# Patient Record
Sex: Male | Born: 1980 | Race: Black or African American | Hispanic: No | Marital: Married | Smoking: Never smoker
Health system: Southern US, Community
[De-identification: ages and names within clinical notes are randomized; demographics above are authoritative.]

## PROBLEM LIST (undated history)

## (undated) DIAGNOSIS — E119 Type 2 diabetes mellitus without complications: Secondary | ICD-10-CM

## (undated) DIAGNOSIS — I10 Essential (primary) hypertension: Secondary | ICD-10-CM

## (undated) DIAGNOSIS — F419 Anxiety disorder, unspecified: Secondary | ICD-10-CM

## (undated) DIAGNOSIS — E669 Obesity, unspecified: Secondary | ICD-10-CM

## (undated) HISTORY — DX: Obesity, unspecified: E66.9

## (undated) HISTORY — DX: Type 2 diabetes mellitus without complications: E11.9

## (undated) HISTORY — DX: Anxiety disorder, unspecified: F41.9

---

## 2005-12-26 ENCOUNTER — Emergency Department (HOSPITAL_COMMUNITY): Admission: EM | Admit: 2005-12-26 | Discharge: 2005-12-26 | Payer: Self-pay | Admitting: Family Medicine

## 2007-04-24 ENCOUNTER — Emergency Department (HOSPITAL_COMMUNITY): Admission: EM | Admit: 2007-04-24 | Discharge: 2007-04-24 | Payer: Self-pay | Admitting: Emergency Medicine

## 2011-08-24 LAB — POCT CARDIAC MARKERS
Myoglobin, poc: 62.5
Myoglobin, poc: 75.9
Operator id: 146091
Operator id: 288331
Troponin i, poc: <0.05

## 2011-08-24 LAB — I-STAT 8, (EC8 V) (CONVERTED LAB)
Chloride: 104
Glucose, Bld: 87
Hemoglobin: 16.7
Potassium: 4.1
Sodium: 138
TCO2: 29
pH, Ven: 7.388 — ABNORMAL HIGH

## 2011-08-24 LAB — POCT I-STAT CREATININE: Operator id: 146091

## 2011-08-24 LAB — D-DIMER, QUANTITATIVE (NOT AT ARMC): D-Dimer, Quant: <0.22

## 2011-12-05 ENCOUNTER — Ambulatory Visit (INDEPENDENT_AMBULATORY_CARE_PROVIDER_SITE_OTHER): Payer: Managed Care, Other (non HMO)

## 2011-12-05 DIAGNOSIS — Z23 Encounter for immunization: Secondary | ICD-10-CM

## 2011-12-05 DIAGNOSIS — N410 Acute prostatitis: Secondary | ICD-10-CM

## 2012-01-16 ENCOUNTER — Emergency Department (HOSPITAL_COMMUNITY)
Admission: EM | Admit: 2012-01-16 | Discharge: 2012-01-16 | Disposition: A | Discharge status: Home / Self Care | Payer: Self-pay

## 2013-01-26 ENCOUNTER — Ambulatory Visit (INDEPENDENT_AMBULATORY_CARE_PROVIDER_SITE_OTHER): Payer: No Typology Code available for payment source | Admitting: Family Medicine

## 2013-01-26 VITALS — BP 154/100 | HR 81 | Temp 98.4°F | Resp 16 | Ht 73.0 in | Wt 283.0 lb

## 2013-01-26 DIAGNOSIS — R059 Cough, unspecified: Secondary | ICD-10-CM

## 2013-01-26 DIAGNOSIS — R040 Epistaxis: Secondary | ICD-10-CM

## 2013-01-26 DIAGNOSIS — R030 Elevated blood-pressure reading, without diagnosis of hypertension: Secondary | ICD-10-CM

## 2013-01-26 DIAGNOSIS — IMO0001 Reserved for inherently not codable concepts without codable children: Secondary | ICD-10-CM

## 2013-01-26 DIAGNOSIS — J069 Acute upper respiratory infection, unspecified: Secondary | ICD-10-CM

## 2013-01-26 DIAGNOSIS — R05 Cough: Secondary | ICD-10-CM

## 2013-01-26 MED ORDER — BENZONATATE 100 MG PO CAPS: ORAL_CAPSULE | ORAL | Status: DC

## 2013-01-26 NOTE — Patient Instructions (Signed)
Drink plenty of fluids  Get plenty of rest  Use Polysporin in the left side of the nose  Cough medication as needed  Monitor blood pressure  Return if worse

## 2013-01-26 NOTE — Progress Notes (Signed)
Subjective: Patient has had a cold for the last couple of days with head congestion and sore throat and doing some coughing. He has sneezed a few times. Today he he was with his wife from whom he is separated. He got angry. His phlegm that he coughed up was bloody, then it was bloody your. Then he started bleeding from the left nares. He came over here to get checked.  Objective: Pleasant alert gentleman in no major distress at this time. TMs normal. Right nares appears normal. Left nares has an area of redness and almost excoriation on the left very the most outward area of the turbinate. His neck was supple without nodes. Chest is clear to auscultation. His throat is clear.  Assessment: URI with secondary cough and pharyngitis Epistaxis Elevated blood pressure  Plan: Use Polysporin his nose. Monitor his blood pressure. Let the cough and cold run its course. Cough suppressant medication. Return if worse. Will give a prescription for Tessalon. If his cough is getting worse we can give him some Hycodan. If he is not doing better by midweek he can return for recheck.

## 2013-01-28 ENCOUNTER — Telehealth: Payer: Self-pay

## 2013-01-28 NOTE — Telephone Encounter (Signed)
Please advise 

## 2013-01-28 NOTE — Telephone Encounter (Signed)
Patient was seen Saturday by Dr. Alwyn Ren and he strongly believes that he does not need cough medication that he needs antibiotics for what he believes is a sinus infection. He is hoping that Dr. Alwyn Ren can call in an antibiotic for him without being seen. Please advise best number: [phone] Pharamacy: [facility] on [address]

## 2013-01-31 MED ORDER — AZITHROMYCIN 250 MG PO TABS: ORAL_TABLET | ORAL | Status: DC

## 2013-01-31 NOTE — Telephone Encounter (Signed)
I apologize for not getting back to him.  This was totally consistent with a virus URI infection, and no antibiotics should be taken for viruses.  If however it has gone on still (5-7 days total) we will rx an antibiotic.    If not better give Z-pack.  Take as directed.  Return if worse

## 2013-01-31 NOTE — Telephone Encounter (Signed)
Left message for him to call me back to advise how he is feeling.

## 2013-01-31 NOTE — Telephone Encounter (Signed)
Patient advised.

## 2014-09-23 ENCOUNTER — Ambulatory Visit (INDEPENDENT_AMBULATORY_CARE_PROVIDER_SITE_OTHER): Payer: BC Managed Care – PPO | Admitting: Physician Assistant

## 2014-09-23 VITALS — BP 142/80 | HR 79 | Temp 97.8°F | Resp 18 | Ht 73.0 in | Wt 278.6 lb

## 2014-09-23 DIAGNOSIS — R030 Elevated blood-pressure reading, without diagnosis of hypertension: Secondary | ICD-10-CM

## 2014-09-23 DIAGNOSIS — H6121 Impacted cerumen, right ear: Secondary | ICD-10-CM

## 2014-09-23 DIAGNOSIS — J019 Acute sinusitis, unspecified: Secondary | ICD-10-CM

## 2014-09-23 DIAGNOSIS — IMO0001 Reserved for inherently not codable concepts without codable children: Secondary | ICD-10-CM

## 2014-09-23 MED ORDER — GUAIFENESIN ER 1200 MG PO TB12: 1.0000 | ORAL_TABLET | Freq: Two times a day (BID) | ORAL | Status: DC | PRN

## 2014-09-23 MED ORDER — AMOXICILLIN-POT CLAVULANATE 875-125 MG PO TABS: 1.0000 | ORAL_TABLET | Freq: Two times a day (BID) | ORAL | Status: DC

## 2014-09-23 NOTE — Progress Notes (Signed)
I was directly involved with the patient's care and agree with the physical, diagnosis and treatment plan.  

## 2014-09-23 NOTE — Patient Instructions (Signed)
Your symptoms are most likely due to a sinus infection (sinusitis). Please take the antibiotic twice daily for 7 days. Please take the mucinex twice daily for 7-10 days.  Using a nasal saline spray may help. Taking a hot shower, going into a sauna, anything where you can be around hot steam may help.  We cleaned out your right ear today.  Please remember to keep an eye on your blood pressure. It was mildly elevated today.   Sinusitis Sinusitis is redness, soreness, and inflammation of the paranasal sinuses. Paranasal sinuses are air pockets within the bones of your face (beneath the eyes, the middle of the forehead, or above the eyes). In healthy paranasal sinuses, mucus is able to drain out, and air is able to circulate through them by way of your nose. However, when your paranasal sinuses are inflamed, mucus and air can become trapped. This can allow bacteria and other germs to grow and cause infection. Sinusitis can develop quickly and last only a short time (acute) or continue over a long period (chronic). Sinusitis that lasts for more than 12 weeks is considered chronic.  CAUSES  Causes of sinusitis include:  Allergies.  Structural abnormalities, such as displacement of the cartilage that separates your nostrils (deviated septum), which can decrease the air flow through your nose and sinuses and affect sinus drainage.  Functional abnormalities, such as when the small hairs (cilia) that line your sinuses and help remove mucus do not work properly or are not present. SIGNS AND SYMPTOMS  Symptoms of acute and chronic sinusitis are the same. The primary symptoms are pain and pressure around the affected sinuses. Other symptoms include:  Upper toothache.  Earache.  Headache.  Bad breath.  Decreased sense of smell and taste.  A cough, which worsens when you are lying flat.  Fatigue.  Fever.  Thick drainage from your nose, which often is green and may contain pus  (purulent).  Swelling and warmth over the affected sinuses. DIAGNOSIS  Your health care provider will perform a physical exam. During the exam, your health care provider may:  Look in your nose for signs of abnormal growths in your nostrils (nasal polyps).  Tap over the affected sinus to check for signs of infection.  View the inside of your sinuses (endoscopy) using an imaging device that has a light attached (endoscope). If your health care provider suspects that you have chronic sinusitis, one or more of the following tests may be recommended:  Allergy tests.  Nasal culture. A sample of mucus is taken from your nose, sent to a lab, and screened for bacteria.  Nasal cytology. A sample of mucus is taken from your nose and examined by your health care provider to determine if your sinusitis is related to an allergy. TREATMENT  Most cases of acute sinusitis are related to a viral infection and will resolve on their own within 10 days. Sometimes medicines are prescribed to help relieve symptoms (pain medicine, decongestants, nasal steroid sprays, or saline sprays).  However, for sinusitis related to a bacterial infection, your health care provider will prescribe antibiotic medicines. These are medicines that will help kill the bacteria causing the infection.  Rarely, sinusitis is caused by a fungal infection. In theses cases, your health care provider will prescribe antifungal medicine. For some cases of chronic sinusitis, surgery is needed. Generally, these are cases in which sinusitis recurs more than 3 times per year, despite other treatments. HOME CARE INSTRUCTIONS   Drink plenty of water. Water  helps thin the mucus so your sinuses can drain more easily.  Use a humidifier.  Inhale steam 3 to 4 times a day (for example, sit in the bathroom with the shower running).  Apply a warm, moist washcloth to your face 3 to 4 times a day, or as directed by your health care provider.  Use  saline nasal sprays to help moisten and clean your sinuses.  Take medicines only as directed by your health care provider.  If you were prescribed either an antibiotic or antifungal medicine, finish it all even if you start to feel better. SEEK IMMEDIATE MEDICAL CARE IF:  You have increasing pain or severe headaches.  You have nausea, vomiting, or drowsiness.  You have swelling around your face.  You have vision problems.  You have a stiff neck.  You have difficulty breathing. MAKE SURE YOU:   Understand these instructions.  Will watch your condition.  Will get help right away if you are not doing well or get worse. Document Released: 10/24/2005 Document Revised: 03/10/2014 Document Reviewed: 11/08/2011 [facility] Patient Information 2015 [place], [place]. This information is not intended to replace advice given to you by your health care provider. Make sure you discuss any questions you have with your health care provider.

## 2014-09-23 NOTE — Progress Notes (Signed)
Subjective:    Patient ID: Jeremy Mooney, male    DOB: [birth year], 33 y.o.   MRN: [id number]  No PCP Per Patient  Chief Complaint  Patient presents with  . Cough    x2 weeks; yellow-green sputum  . Sore Throat  . Sinusitis    extreme sinus pressure; pt states it hurts when he lays down; hard to sleep   There are no active problems to display for this patient.  Prior to Admission medications   Medication Sig Start Date End Date Taking? Authorizing Provider  amoxicillin-clavulanate (AUGMENTIN) 875-125 MG per tablet Take 1 tablet by mouth 2 (two) times daily. 09/23/14   Abir Craine, PA  Guaifenesin [facility](MUCINEX MAXIMUM STRENGTH) 1200 MG TB12 Take 1 tablet (1,200 mg total) by mouth every 12 (twelve) hours as needed. 09/23/14   Raelyn Ensignodd Miran Kautzman, PA   Medications, allergies, past medical history, surgical history, family history, social history and problem list reviewed and updated.  HPI  [age] yom healthy male with no sig PMH presents with head pressure, congestion.  He initially started feeling sick 2 wks ago after his toddler son was sick. Pt initially felt sore throat, had rhinorrhea, and had prod cough with yellow/green sputum. This lasted for approx 1 week and these sx resolved. Unfortunately after these sx resolved he began to feel that his head was congested about 5 days ago. He has felt congested, unable to lie flat, and dizzy when he bends forward over the past few days. He hasn't checked his temp at home. Denies chills. No abd pain, N/V, diarrhea. No SOB, CP. Has tried ibuprofen and dayquil which haven't helped with the congestion.   Review of Systems No dysuria. See HPI.     Objective:   Physical Exam  Constitutional: He is oriented to person, place, and time. He appears well-developed and well-nourished.  Non-toxic appearance. He does not have a sickly appearance. He appears ill. No distress.  BP 142/80 mmHg  Pulse 79  Temp(Src) 97.8 F (36.6 C) (Oral)  Resp 18  Ht 6\' 1"  (1.854  m)  Wt 278 lb 9.6 oz (126.372 kg)  BMI 36.76 kg/m2  SpO2 98%   HENT:  Head: Normocephalic and atraumatic.  Left Ear: Tympanic membrane is not injected, not erythematous, not retracted and not bulging. A middle ear effusion is present.  Nose: Mucosal edema present. No rhinorrhea or sinus tenderness. Right sinus exhibits no maxillary sinus tenderness and no frontal sinus tenderness. Left sinus exhibits no maxillary sinus tenderness and no frontal sinus tenderness.  Mouth/Throat: Uvula is midline, oropharynx is clear and moist and mucous membranes are normal. No oropharyngeal exudate, posterior oropharyngeal edema or posterior oropharyngeal erythema.  Right ear with cerumen impaction.   Eyes: Conjunctivae and EOM are normal. Pupils are equal, round, and reactive to light.  Neck: No Brudzinski's sign noted.  Cardiovascular: Normal rate, regular rhythm, S1 normal, S2 normal and normal heart sounds.  Exam reveals no gallop.   No murmur heard. Pulmonary/Chest: Breath sounds normal. He has no decreased breath sounds. He has no wheezes. He has no rhonchi. He has no rales.  Lymphadenopathy:       Head (right side): No submental, no submandibular and no tonsillar adenopathy present.       Head (left side): No submental, no submandibular and no tonsillar adenopathy present.    He has no cervical adenopathy.  Neurological: He is alert and oriented to person, place, and time.  Psychiatric: He has a normal mood and affect.  His speech is normal.   Right ear flushed: Superior TM erythematous post flush. No fluid. Not retracted, bulging.      Assessment & Plan:   [age] yom healthy male with no sig PMH presents with head pressure, congestion.  Acute sinusitis, recurrence not specified, unspecified location - Plan: amoxicillin-clavulanate (AUGMENTIN) 875-125 MG per tablet, Guaifenesin (MUCINEX MAXIMUM STRENGTH) 1200 MG TB12 --Sinusitis likely cause of sx as initial uri had resolved and head  congestion/pressure secondarily presented --augmentin --mucinex --recommend nasal saline, warm showers/sauna for steam --rtc 5 days if not resolved  Cerumen impaction, right --right ear flushed  Elevated blood pressure --Mildly elevated in clinic today --no hx htn --pt states going through stressful few weeks, encouraged to monitor BP at home with cuff or at pharmacy, f/u with us if readings stay >140/90.  Donnajean Lopesodd M. Jaedin Trumbo, PA-C Physician Assistant-Certified [facility]  09/23/2014 9:55 AM    Steam  Nasal spray mucinex

## 2014-11-07 ENCOUNTER — Ambulatory Visit (INDEPENDENT_AMBULATORY_CARE_PROVIDER_SITE_OTHER): Payer: BLUE CROSS/BLUE SHIELD

## 2014-11-07 ENCOUNTER — Ambulatory Visit (INDEPENDENT_AMBULATORY_CARE_PROVIDER_SITE_OTHER): Payer: BC Managed Care – PPO | Admitting: Family Medicine

## 2014-11-07 VITALS — BP 136/90 | HR 82 | Temp 98.1°F | Resp 16 | Ht 72.5 in | Wt 282.0 lb

## 2014-11-07 DIAGNOSIS — J029 Acute pharyngitis, unspecified: Secondary | ICD-10-CM

## 2014-11-07 DIAGNOSIS — S52021G Displaced fracture of olecranon process without intraarticular extension of right ulna, subsequent encounter for closed fracture with delayed healing: Secondary | ICD-10-CM

## 2014-11-07 DIAGNOSIS — M7712 Lateral epicondylitis, left elbow: Secondary | ICD-10-CM

## 2014-11-07 LAB — POCT RAPID STREP A (OFFICE): Rapid Strep A Screen: NEGATIVE

## 2014-11-07 MED ORDER — HYDROCODONE-ACETAMINOPHEN 7.5-325 MG/15ML PO SOLN: 5.0000 mL | ORAL | Status: DC | PRN

## 2014-11-07 MED ORDER — CEFDINIR 300 MG PO CAPS: 600.0000 mg | ORAL_CAPSULE | Freq: Every day | ORAL | Status: DC

## 2014-11-07 MED ORDER — DICLOFENAC SODIUM 3 % TD GEL: 2.0000 g | Freq: Four times a day (QID) | TRANSDERMAL | Status: DC

## 2014-11-07 MED ORDER — MELOXICAM 15 MG PO TABS: 15.0000 mg | ORAL_TABLET | Freq: Every day | ORAL | Status: DC

## 2014-11-07 NOTE — Progress Notes (Signed)
Subjective:    Patient ID: Jeremy Mooney, male    DOB: 10-Jul-1981, 34 y.o.   MRN: [id number] Chief Complaint  Patient presents with  . Sore Throat    HPI  Developed a sore throat 3d ago - usually does at the beginning of a URI but this time never really progressed to this but instead throat pain has significantly worsened.  Having a lot of severe stabbing pain, difficult to swallow.  Then he saw white spots on the back of hit throat and tongue.  + chills, + rhinitis, + cough.  Coughing of yellow-green mucous. Still able to eat and drink though very painful. Using theraflu and dayquil as well as advil for arthralgias/myalgias. Has a 2 yo son in daycare.  Sev mos ago pt was wrestling with his son and heard his right elbow pop - since then he has had elbow pain, even with lifting minimal things such as his camera and using the mouse - pt is a Environmental manager.  Had similar injury many years prior that was never evaluated or treated. Pain is intermittent but no improvement with RICE or nsaids.  Past Medical History  Diagnosis Date  . Anxiety    No current outpatient prescriptions on file prior to visit.   No current facility-administered medications on file prior to visit.   No Known Allergies   Review of Systems  Constitutional: Positive for chills, activity change, appetite change and fatigue. Negative for fever and diaphoresis.  HENT: Positive for congestion, postnasal drip, rhinorrhea, sore throat, trouble swallowing and voice change.   Respiratory: Positive for cough. Negative for shortness of breath and wheezing.   Musculoskeletal: Positive for myalgias and arthralgias. Negative for back pain, joint swelling and gait problem.  Skin: Negative for color change, rash and wound.  Neurological: Positive for weakness. Negative for numbness.  Hematological: Negative for adenopathy. Does not bruise/bleed easily.       Objective:  BP 136/90 mmHg  Pulse 82  Temp(Src) 98.1 F (36.7 C)  (Oral)  Resp 16  Ht 6' 0.5" (1.842 m)  Wt 282 lb (127.914 kg)  BMI 37.70 kg/m2  SpO2 99%  Physical Exam  Constitutional: He is oriented to person, place, and time. He appears well-developed and well-nourished. No distress.  HENT:  Head: Normocephalic and atraumatic.  Right Ear: Tympanic membrane, external ear and ear canal normal.  Left Ear: Tympanic membrane, external ear and ear canal normal.  Nose: Nose normal.  Mouth/Throat: Uvula is midline and mucous membranes are normal. No trismus in the jaw. No uvula swelling. Posterior oropharyngeal erythema present. No oropharyngeal exudate or posterior oropharyngeal edema.  Eyes: Conjunctivae are normal. No scleral icterus.  Neck: Normal range of motion. Neck supple. No thyromegaly present.  Cardiovascular: Normal rate, regular rhythm, normal heart sounds and intact distal pulses.   Pulmonary/Chest: Effort normal and breath sounds normal. No respiratory distress.  Musculoskeletal: He exhibits no edema.       Right shoulder: Normal.       Right elbow: He exhibits normal range of motion, no swelling, no effusion and no laceration. Tenderness found. Lateral epicondyle and olecranon process tenderness noted. No radial head and no medial epicondyle tenderness noted.       Left elbow: Normal.       Right wrist: Normal.  Lymphadenopathy:    He has no cervical adenopathy.  Neurological: He is alert and oriented to person, place, and time. He has normal strength. He displays no atrophy and no tremor. No  sensory deficit. He exhibits normal muscle tone. Gait normal.  Skin: Skin is warm and dry. He is not diaphoretic. No erythema.  Psychiatric: He has a normal mood and affect. His behavior is normal.      Results for orders placed or performed in visit on 11/07/14  POCT rapid strep A  Result Value Ref Range   Rapid Strep A Screen Negative Negative   [facility] reading (PRIMARY) by  Dr. Clelia Croft. Right elbow: avulsion fracture of tip of bony spur off of  olecranon.    Dg Elbow Complete Left  11/07/2014   CLINICAL DATA:  Elbow pain  EXAM: LEFT ELBOW - COMPLETE 3+ VIEW  COMPARISON:  None.  FINDINGS: There is osteophytosis extending from the olecranon with cortical discontinuity at the osteophyte suggesting fracture. This is best seen on lateral view. There is no dislocation. Soft tissues are unremarkable.  IMPRESSION: There is osteophytosis extending from the olecranon with cortical discontinuity at the osteophyte suggesting fracture. This is best seen on lateral view.   Electronically Signed   By: Sherian Rein M.D.   On: 11/07/2014 10:43    Assessment & Plan:   Sore throat - Plan: POCT rapid strep A, Culture, Group A Strep  Lateral epicondylitis of elbow, left - Plan: DG Elbow Complete Left, Ambulatory referral to Orthopedic Surgery  Olecranon fracture, right, closed, with delayed healing, subsequent encounter - Plan: Ambulatory referral to Orthopedic Surgery - recommend using tennis elbow strap at all times until ortho eval to avoid further displacement or injury to olecranon avulsion fracture - this  Is subacute from sev mos prior.  Meds ordered this encounter  Medications  . Diclofenac Sodium 3 % GEL    Sig: Place 2 g onto the skin 4 (four) times daily.    Dispense:  100 g    Refill:  1  . meloxicam (MOBIC) 15 MG tablet    Sig: Take 1 tablet (15 mg total) by mouth daily.    Dispense:  30 tablet    Refill:  1  . HYDROcodone-acetaminophen (HYCET) 7.5-325 mg/15 ml solution    Sig: Take 5-10 mLs by mouth every 4 (four) hours as needed for moderate pain.    Dispense:  140 mL    Refill:  0  . cefdinir (OMNICEF) 300 MG capsule    Sig: Take 2 capsules (600 mg total) by mouth daily.    Dispense:  20 capsule    Refill:  0    I personally performed the services described in this documentation, which was scribed in my presence. The recorded information has been reviewed and considered, and addended by me as needed.  Norberto Sorenson, MD MPH

## 2014-11-07 NOTE — Patient Instructions (Signed)

## 2014-11-09 LAB — CULTURE, GROUP A STREP: Organism ID, Bacteria: NORMAL

## 2015-07-02 ENCOUNTER — Ambulatory Visit (INDEPENDENT_AMBULATORY_CARE_PROVIDER_SITE_OTHER): Payer: BLUE CROSS/BLUE SHIELD | Admitting: Family Medicine

## 2015-07-02 ENCOUNTER — Ambulatory Visit (INDEPENDENT_AMBULATORY_CARE_PROVIDER_SITE_OTHER): Payer: BLUE CROSS/BLUE SHIELD

## 2015-07-02 VITALS — BP 138/80 | HR 74 | Temp 98.1°F | Resp 18 | Ht 73.5 in | Wt 286.0 lb

## 2015-07-02 DIAGNOSIS — M25571 Pain in right ankle and joints of right foot: Secondary | ICD-10-CM

## 2015-07-02 DIAGNOSIS — M25511 Pain in right shoulder: Secondary | ICD-10-CM

## 2015-07-02 DIAGNOSIS — M12511 Traumatic arthropathy, right shoulder: Secondary | ICD-10-CM | POA: Diagnosis not present

## 2015-07-02 DIAGNOSIS — M12811 Other specific arthropathies, not elsewhere classified, right shoulder: Secondary | ICD-10-CM

## 2015-07-02 NOTE — Progress Notes (Signed)
Chief Complaint:  Chief Complaint  Patient presents with  . Foot Pain    rt great toe x2 mths     HPI: Jeremy Mooney is a 34 y.o. 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 No hx of gout.  Right ankle fracture and prior broken great toe  Right shoulder pain , separated shoulder in 1995 playign foootball, last 1 year worse, after bowling, sleeping on it makes worse,  He no numbness but some tingling if he sleeps on it HE has no neck pain. HE has problems with shootingpain when he brings his arms down from shoulder to wiast height, he has problmes with internal and external rotation Sharp and cahing pain, soemtimes no triggers, sharp pain with certain ROM.     Past Medical History  Diagnosis Date  . Anxiety    History reviewed. No pertinent past surgical history. Social History   Social History  . Marital Status: Married    Spouse Name: N/A  . Number of Children: N/A  . Years of Education: N/A   Social History Main Topics  . Smoking status: Never Smoker   . Smokeless tobacco: Never Used  . Alcohol Use: Yes  . Drug Use: No  . Sexual Activity: Yes   Other Topics Concern  . None   Social History Narrative   Family History  Problem Relation Age of Onset  . Cancer Father   . Bowel Disease Mother    No Known Allergies Prior to Admission medications   Medication Sig Start Date End Date Taking? Authorizing Provider  cefdinir (OMNICEF) 300 MG capsule Take 2 capsules (600 mg total) by mouth daily. Patient not taking: Reported on 07/02/2015 11/07/14   Sherren Mocha, MD  Diclofenac Sodium 3 % GEL Place 2 g onto the skin 4 (four) times daily. Patient not taking: Reported on 07/02/2015 11/07/14   Sherren Mocha, MD    HYDROcodone-acetaminophen (HYCET) 7.5-325 mg/15 ml solution Take 5-10 mLs by mouth every 4 (four) hours as needed for moderate pain. Patient not taking: Reported on 07/02/2015 11/07/14   Sherren Mocha, MD  meloxicam (MOBIC) 15 MG tablet Take 1 tablet (15 mg total) by mouth daily. Patient not taking: Reported on 07/02/2015 11/07/14   Sherren Mocha, MD     ROS: The patient denies fevers, chills, night sweats, unintentional weight loss, chest pain, palpitations, wheezing, dyspnea on exertion, nausea, vomiting, abdominal pain, dysuria, hematuria, melena, numbness, weakness, or tingling.   All other systems have been reviewed and were otherwise negative with the exception of those mentioned in the HPI and as above.    PHYSICAL EXAM: Filed Vitals:   07/02/15 0921  BP: 138/80  Pulse: 74  Temp: 98.1 F (36.7 C)  Resp: 18   Body mass index is 37.22 kg/(m^2).   General: Alert, no acute distress HEENT:  Normocephalic, atraumatic, oropharynx patent. EOMI, PERRLA Cardiovascular:  Regular rate and rhythm, no rubs murmurs or gallops.  No Carotid bruits, radial pulse intact. No pedal edema.  Respiratory: Clear to auscultation bilaterally.  No wheezes, rales, or rhonchi.  No cyanosis, no use of accessory musculature Abdominal: No organomegaly, abdomen is soft  and non-tender, positive bowel sounds. No masses. Skin: No rashes. Neurologic: Facial musculature symmetric. Psychiatric: Patient acts appropriately throughout our interaction. Lymphatic: No cervical or submandibular lymphadenopathy Musculoskeletal: Gait intact. No edema, tenderness Right foot-tender at base of great toe near sesmoid, no erythema, no warmth,  Right shoulder Neck exam normal-neg spurling Shoulder No deformity, no hypertrophy/atrophy, no erythema, no fluid, no wounds Full ROM Nontender at [facility] jt Neg Empty Can test, neg Lift off test, neg Speeds, Neg Hawkins/Neers 5/5 strength, 2/2 triceps and biceps DTRs      LABS: Results for  orders placed or performed in visit on 11/07/14  Culture, Group A Strep  Result Value Ref Range   Organism ID, Bacteria Normal Upper Respiratory Flora    Organism ID, Bacteria No Beta Hemolytic Streptococci Isolated   POCT rapid strep A  Result Value Ref Range   Rapid Strep A Screen Negative Negative     EKG/XRAY:   Primary read interpreted by Dr. Conley Rolls at [facility]. Please comment about lucency at distal  Proximal phalanges great toe Old chronic changes prior fx  Base 1st metatarsal Shoulder is normal   ASSESSMENT/PLAN: Encounter Diagnoses  Name Primary?  . Pain in joint, ankle and foot, right Yes  . Right shoulder pain   . Rotator cuff arthropathy, right     34 y/o male with right foot and shoulde rpain Try voltaren gel for now, he has beenn on mobic in the past with minimal releif.  Change foot wear, wear insoles Will let him know about official xray result and plan Gross sideeffects, risk and benefits, and alternatives of medications d/w patient. Patient is aware that all medications have potential sideeffects and we are unable to predict every sideeffect or drug-drug interaction that may occur.  Hamilton Capri DO  07/02/2015 11:32 AM   Dw patient official xray results Will try a trial of indocin and see if it helps EXAM: RIGHT FOOT COMPLETE - 3+ VIEW  COMPARISON: None.  FINDINGS: No acute bony or joint abnormality identified. No evidence fracture dislocation. Diffuse degenerative change.  IMPRESSION: No acute or focal abnormality. Diffuse degenerative change.   Electronically Signed  By: Maisie Fus Register  On: 07/02/2015 12:49

## 2015-07-02 NOTE — Patient Instructions (Signed)

## 2015-07-03 MED ORDER — INDOMETHACIN 50 MG PO CAPS: 50.0000 mg | ORAL_CAPSULE | Freq: Two times a day (BID) | ORAL | Status: DC

## 2016-02-20 ENCOUNTER — Ambulatory Visit (INDEPENDENT_AMBULATORY_CARE_PROVIDER_SITE_OTHER): Payer: BLUE CROSS/BLUE SHIELD

## 2016-02-20 ENCOUNTER — Ambulatory Visit (HOSPITAL_COMMUNITY)
Admission: EM | Admit: 2016-02-20 | Discharge: 2016-02-20 | Disposition: A | Discharge status: Home / Self Care | Payer: BLUE CROSS/BLUE SHIELD | Attending: Emergency Medicine | Admitting: Emergency Medicine

## 2016-02-20 ENCOUNTER — Encounter (HOSPITAL_COMMUNITY): Payer: Self-pay | Admitting: *Deleted

## 2016-02-20 DIAGNOSIS — S66911A Strain of unspecified muscle, fascia and tendon at wrist and hand level, right hand, initial encounter: Secondary | ICD-10-CM

## 2016-02-20 DIAGNOSIS — M25511 Pain in right shoulder: Secondary | ICD-10-CM

## 2016-02-20 DIAGNOSIS — IMO0001 Reserved for inherently not codable concepts without codable children: Secondary | ICD-10-CM

## 2016-02-20 NOTE — ED Provider Notes (Signed)
CSN: [id number]     Arrival date & time 02/20/16  1856 History   First MD Initiated Contact with Patient 02/20/16 1946     Chief Complaint  Patient presents with  . Hand Injury   (Consider location/radiation/quality/duration/timing/severity/associated sxs/prior Treatment) HPI  Jeremy Mooney is a 35 year old man here for evaluation of right thumb injury. Jeremy Mooney states around 4:30 this afternoon Jeremy Mooney was pushing a shopping cart when Jeremy Mooney injured his right thumb. Jeremy Mooney states Jeremy Mooney thinks it got bent backwards, but is not sure. Jeremy Mooney has had pain at the base of the thumb. It is worse with extension and abduction. Jeremy Mooney does report minimal swelling. Jeremy Mooney has taken Advil with some improvement of pain.  Jeremy Mooney also reports gradually worsening pain in the right shoulder over the last year. Jeremy Mooney reports a traumatic dislocation in his late teens. Jeremy Mooney reports pain is worse when Jeremy Mooney brings his arm down from prolonged abduction. It will also sometimes flareup with rapid extension. This has never been evaluated.  Past Medical History  Diagnosis Date  . Anxiety    History reviewed. No pertinent past surgical history. Family History  Problem Relation Age of Onset  . Cancer Father   . Bowel Disease Mother    Social History  Substance Use Topics  . Smoking status: Never Smoker   . Smokeless tobacco: Never Used  . Alcohol Use: No    Review of Systems As in history of present illness Allergies  Review of patient's allergies indicates no known allergies.  Home Medications   Prior to Admission medications   Not on File   Meds Ordered and Administered this Visit  Medications - No data to display  BP 152/89 mmHg  Pulse 66  Temp(Src) 98.2 F (36.8 C) (Oral)  SpO2 98% No data found.   Physical Exam  Constitutional: Jeremy Mooney is oriented to person, place, and time. Jeremy Mooney appears well-developed and well-nourished. No distress.  Cardiovascular: Normal rate.   Pulmonary/Chest: Effort normal.  Musculoskeletal:  Right thumb: No erythema or  edema. No bruising. No joint laxity when compared to the left. Jeremy Mooney does have some tenderness at the MCP joint.  Brisk cap refill. Normal range of motion. Right shoulder: No erythema or edema. Jeremy Mooney does have some tenderness over the bicipital groove as well as the before meals joint. Empty can negative. Pain when stressing the ac joint.  Neurological: Jeremy Mooney is alert and oriented to person, place, and time.    ED Course  Procedures (including critical care time)  Labs Review Labs Reviewed - No data to display  Imaging Review Dg Hand Complete Right  02/20/2016  CLINICAL DATA:  Right hand pain after blunt trauma EXAM: RIGHT HAND - COMPLETE 3+ VIEW COMPARISON:  None. FINDINGS: No fracture, dislocation, focal osseous lesion or pathologic soft tissue calcifications. 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 IMPRESSION: No fracture or malalignment. Electronically Signed   By: Delbert PhenixJason A Poff M.D.   On: 02/20/2016 20:21      MDM   1. Strain of right thumb, initial encounter   2. Pain in right acromioclavicular joint    Thumb spica brace for thumb sprain. Recommended ice and ibuprofen.  I suspect Jeremy Mooney has some arthritis in his before meals joint. Recommended follow-up with orthopedics for additional evaluation.    Charm RingsErin J Vivienne Sangiovanni, MD 02/20/16 2036

## 2016-02-20 NOTE — ED Notes (Addendum)
Reports jamming right thumb on shopping cart @ approx 1630 today.  C/O pain to base of thumb.

## 2016-02-20 NOTE — Discharge Instructions (Signed)
You have strained the ligaments of your thumb. Wear the brace for the next several days. It is okay to remove this to shower and eat.  You do not have to sleep in it. Apply ice 3 times a day. Take ibuprofen 600 mg every 6 hours as needed for pain.  I suspect to have some arthritis in your ac joint of your shoulder. Ice and ibuprofen will help this as well. Since it has been becoming more bothersome, I would recommend following up with Dr. Charlann Boxerlin in orthopedics for additional evaluation.

## 2016-10-01 IMAGING — CR DG FOOT COMPLETE 3+V*R*
3 series · 3 of 3 positions shown · non-contrast
Comparison: None.

CLINICAL DATA: Pain.  Initial evaluation.

EXAM:
RIGHT FOOT COMPLETE - 3+ VIEW

[AP]
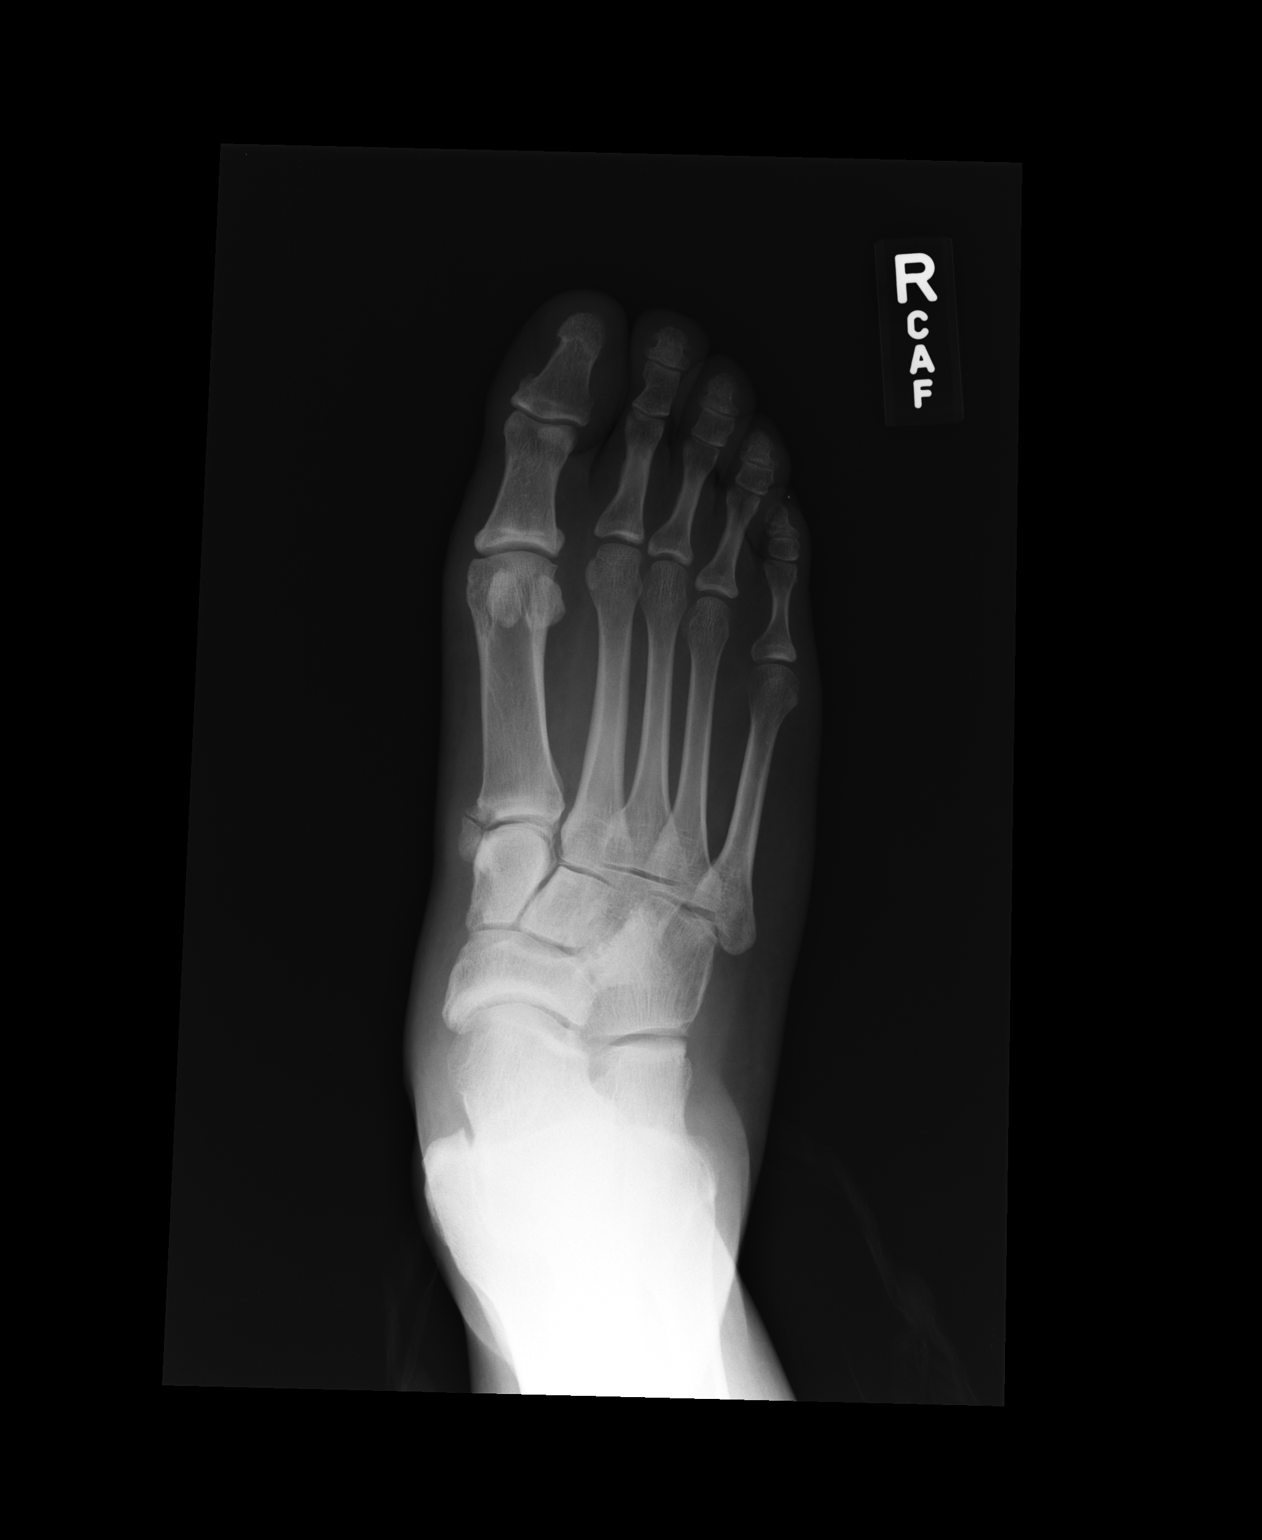

[ap obl int rot]
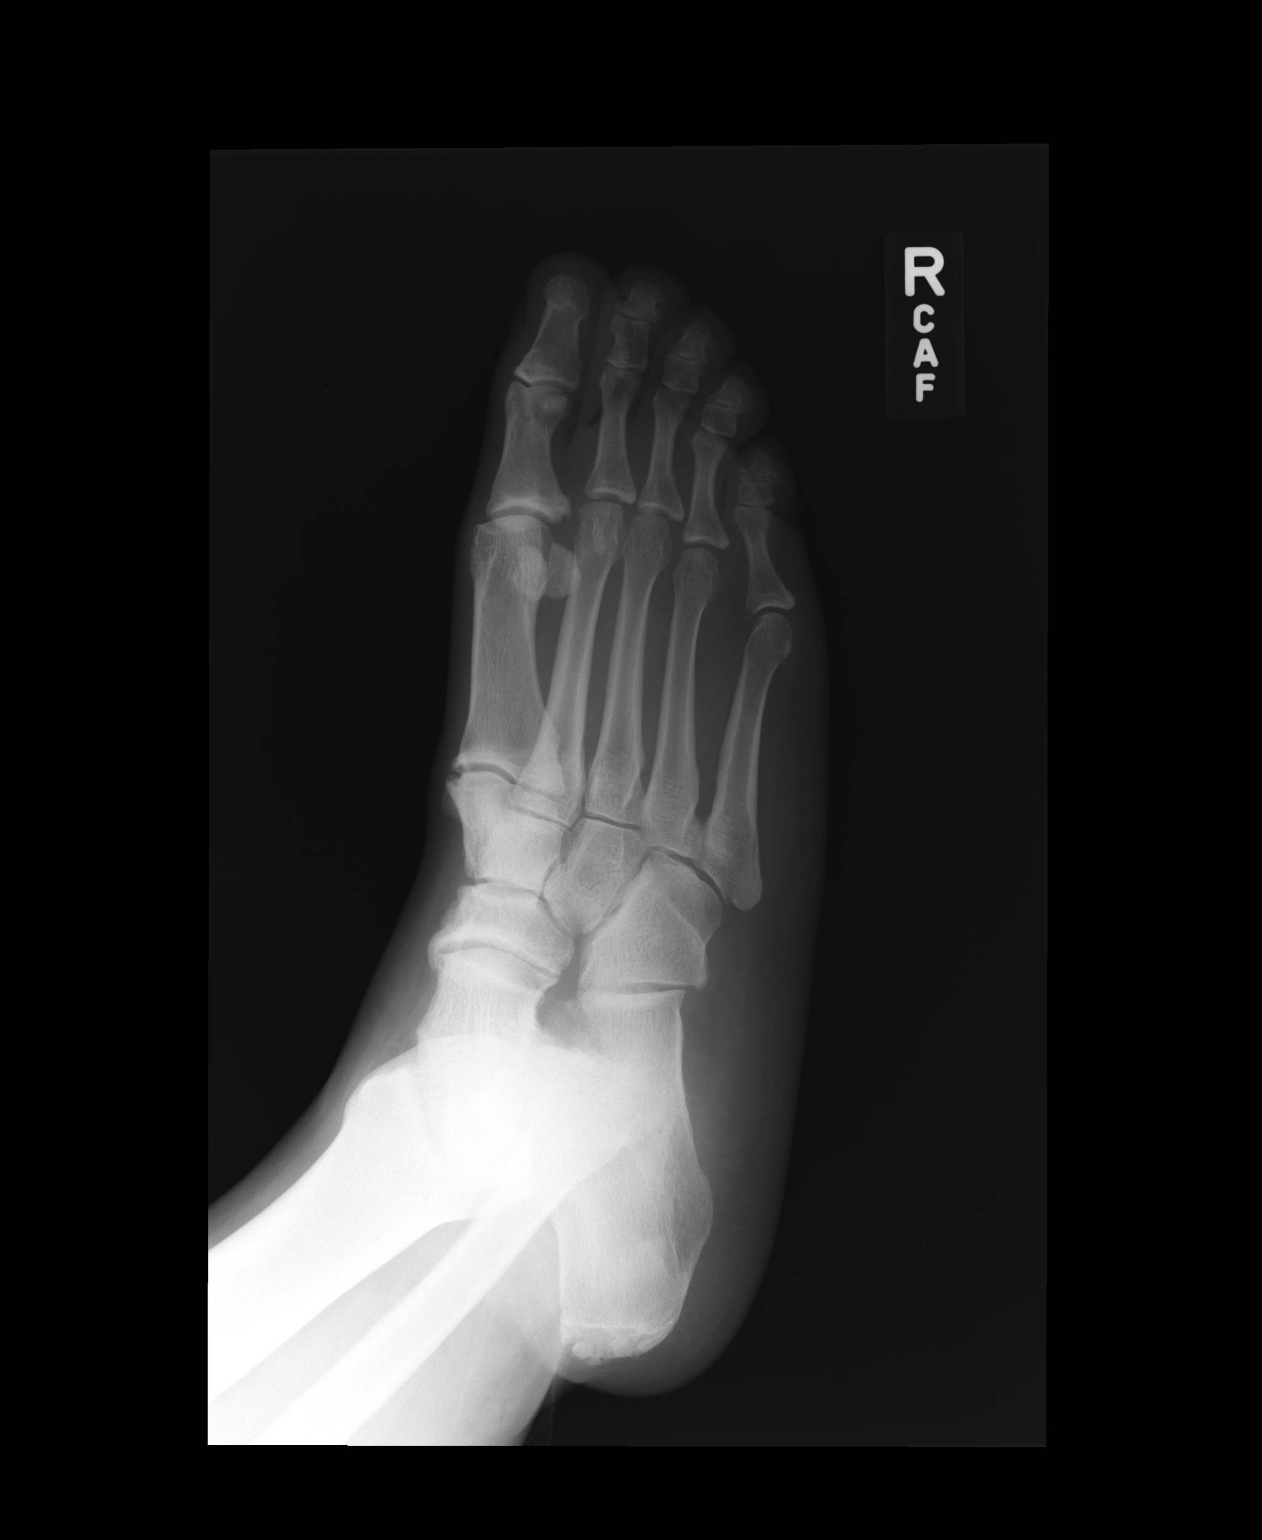

[lateral]
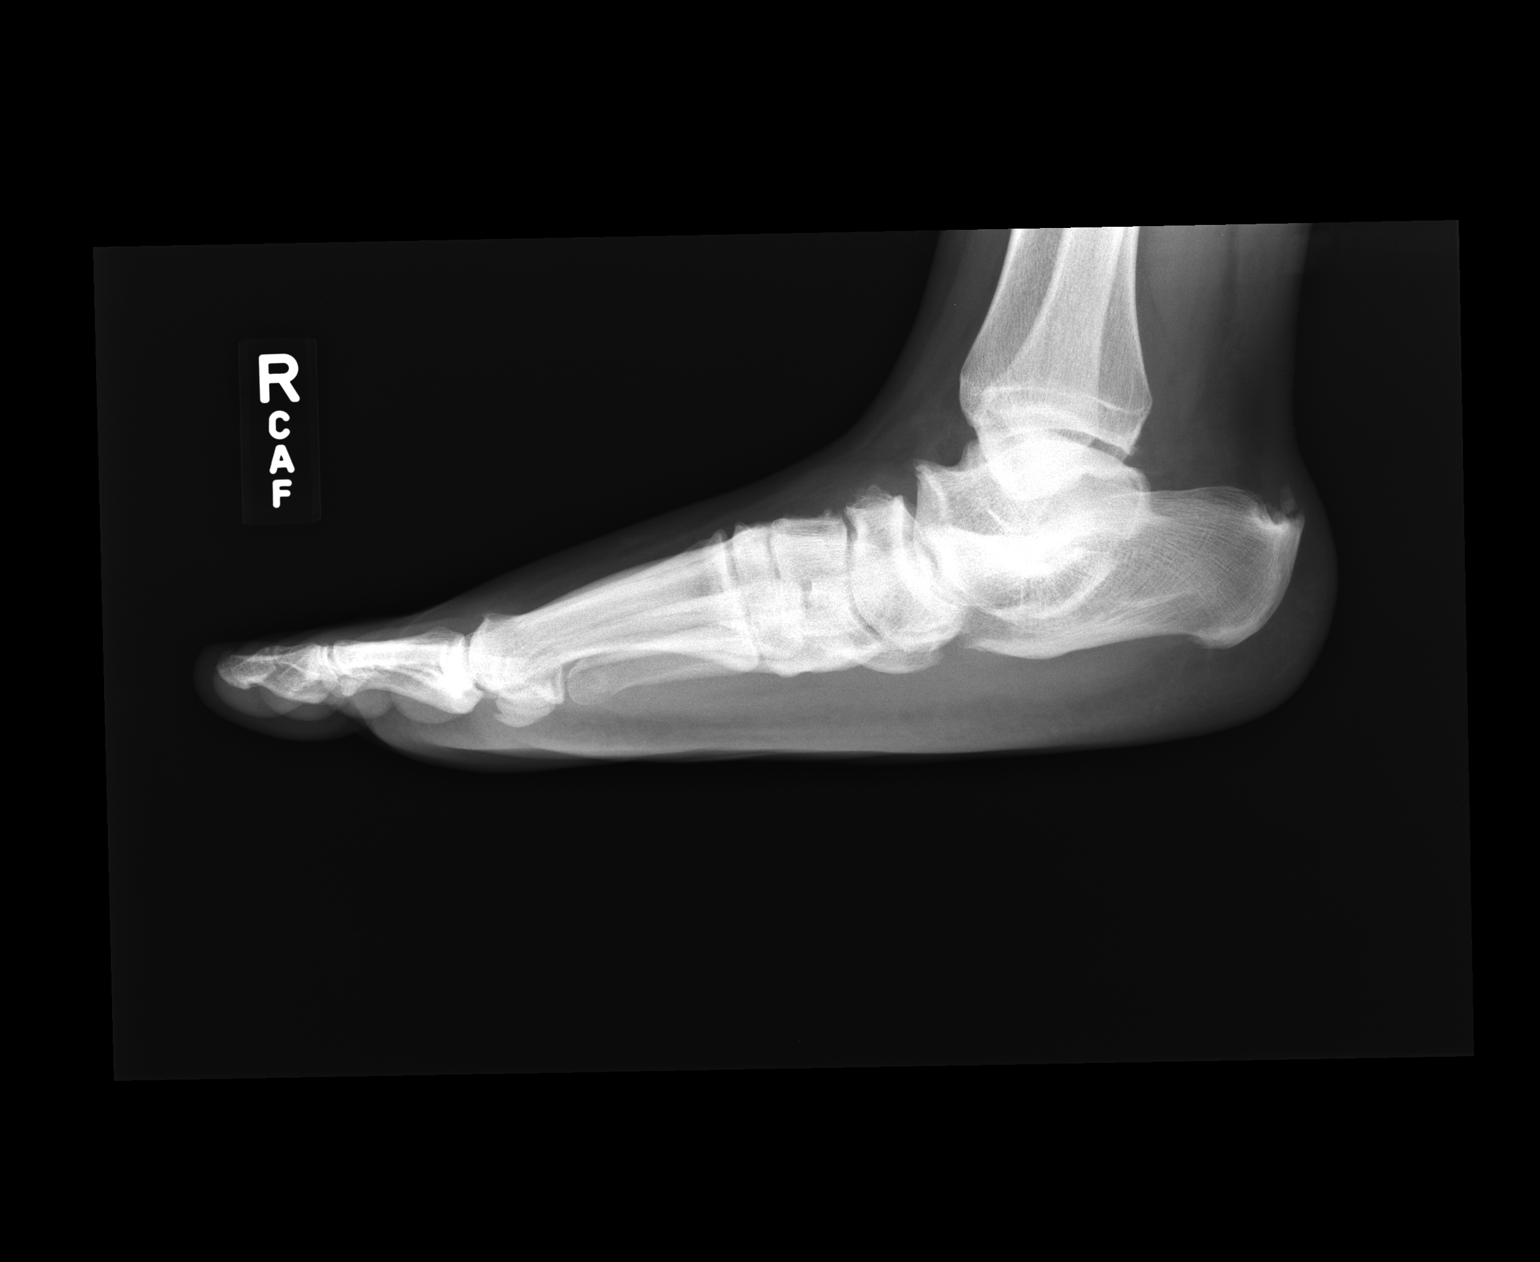

[3 of 3 positions shown; findings below may reference images not displayed]

FINDINGS: No acute bony or joint abnormality identified. No evidence fracture
dislocation. Diffuse degenerative change.
IMPRESSION: No acute or focal abnormality.  Diffuse degenerative change.

## 2016-10-01 IMAGING — CR DG SHOULDER 2+V*R*
3 series · 3 of 3 positions shown · non-contrast
Comparison: None.

CLINICAL DATA: Chronic right shoulder pain, increasing for 1 year.

EXAM:
RIGHT SHOULDER - 2+ VIEW

[AP (1 of 2)]
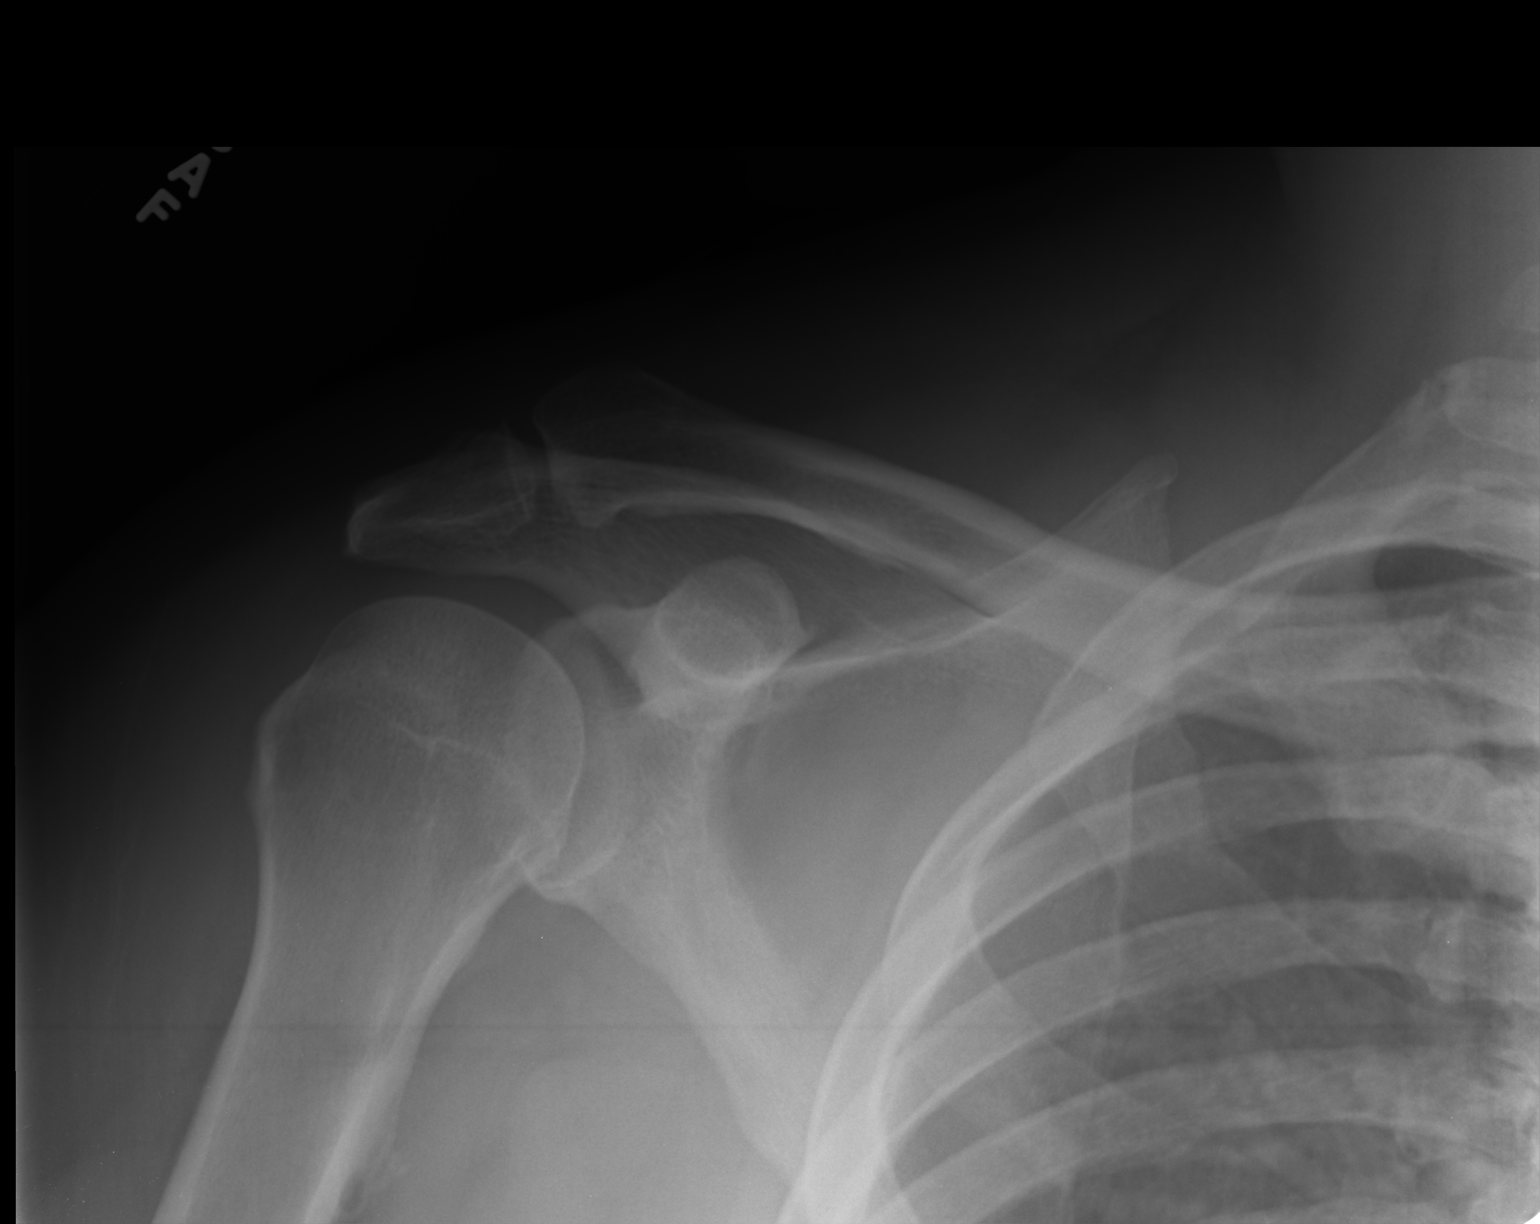

[AP (2 of 2)]
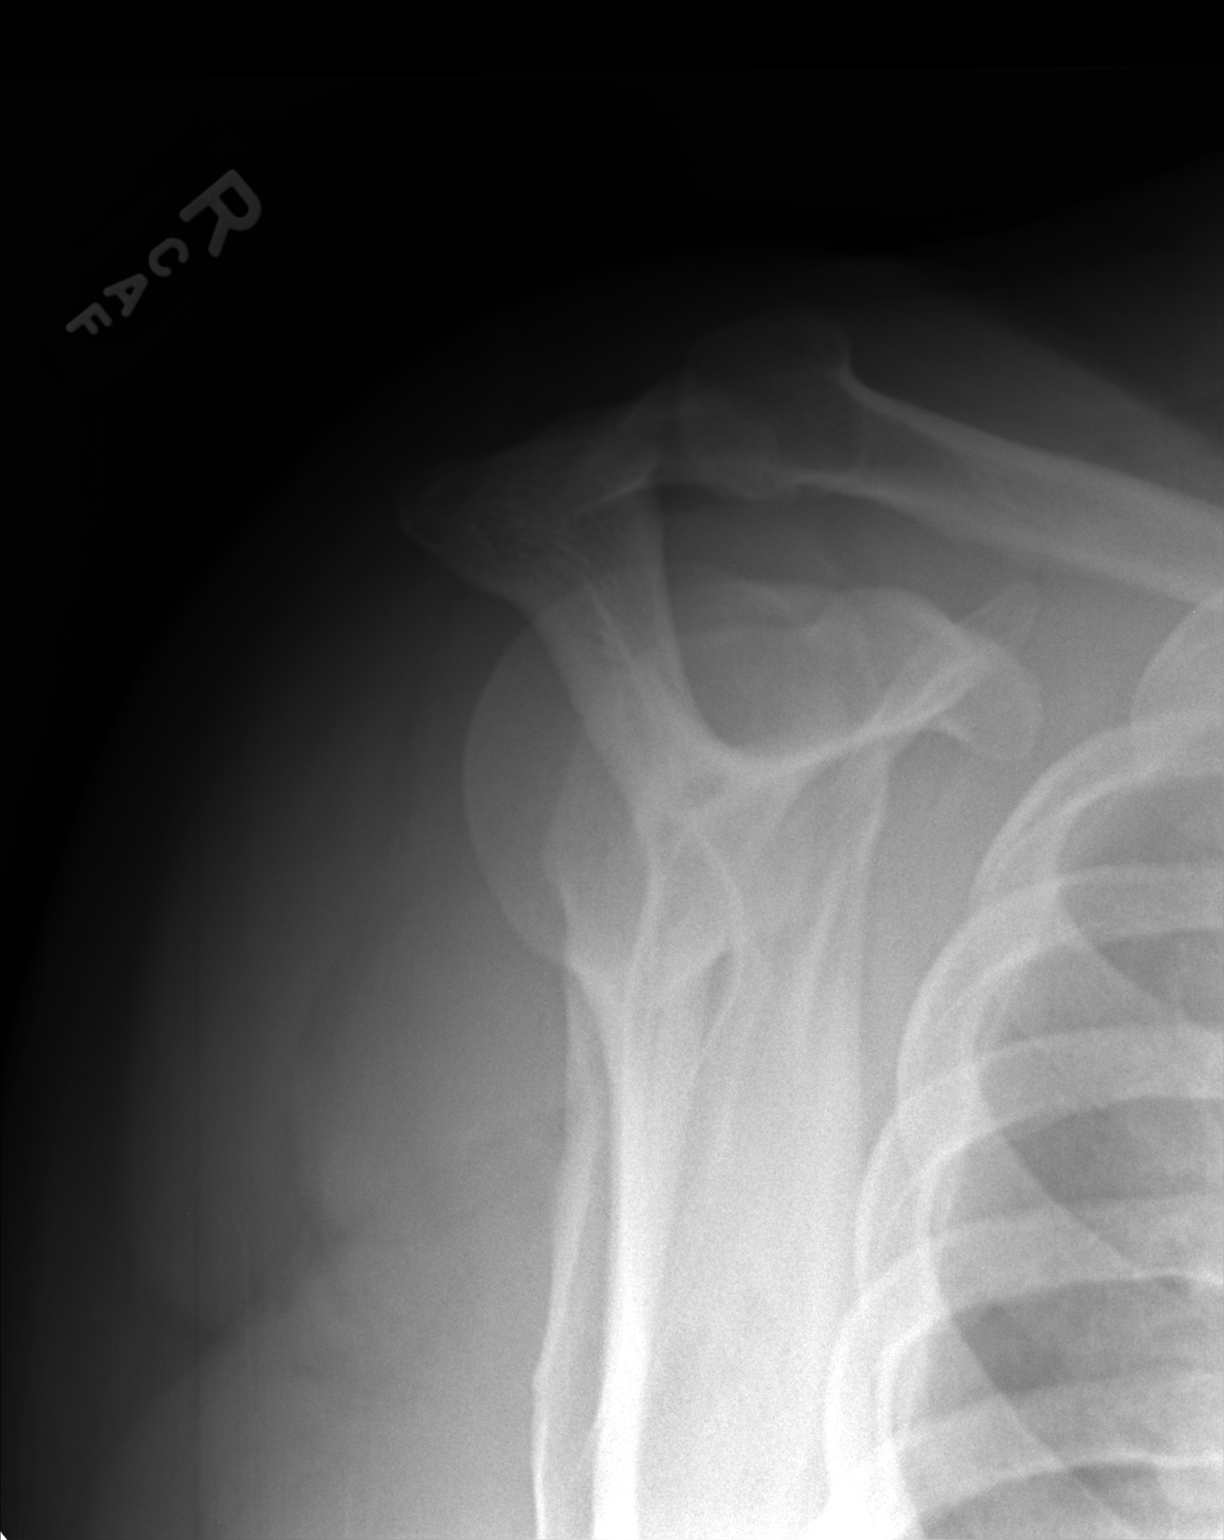

[axial]
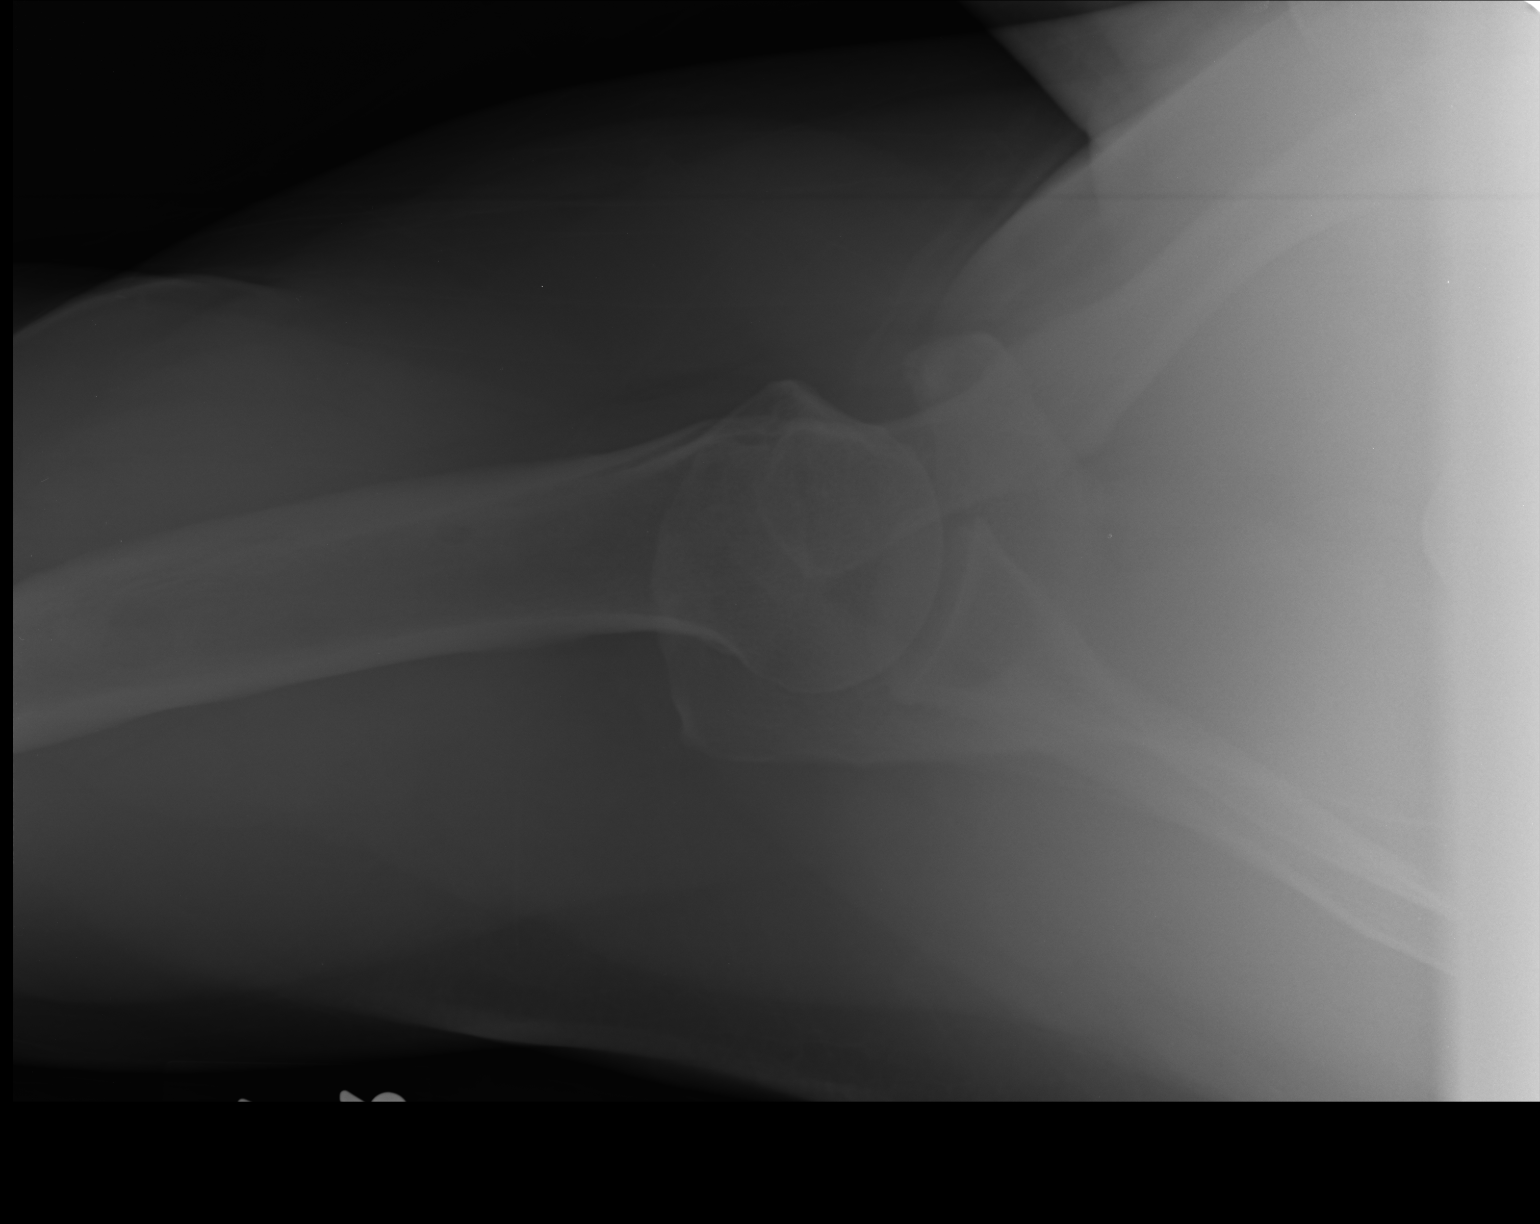

[3 of 3 positions shown; findings below may reference images not displayed]

FINDINGS: There is no evidence of fracture or dislocation. There is no
evidence of arthropathy or other focal bone abnormality. Soft
tissues are unremarkable.
IMPRESSION: Negative.

## 2017-08-13 ENCOUNTER — Emergency Department (HOSPITAL_COMMUNITY): Payer: BLUE CROSS/BLUE SHIELD

## 2017-08-13 ENCOUNTER — Emergency Department (HOSPITAL_COMMUNITY)
Admission: EM | Admit: 2017-08-13 | Discharge: 2017-08-13 | Disposition: A | Discharge status: Home / Self Care | Payer: BLUE CROSS/BLUE SHIELD | Attending: Emergency Medicine | Admitting: Emergency Medicine

## 2017-08-13 ENCOUNTER — Encounter (HOSPITAL_COMMUNITY): Payer: Self-pay

## 2017-08-13 DIAGNOSIS — T887XXA Unspecified adverse effect of drug or medicament, initial encounter: Secondary | ICD-10-CM | POA: Insufficient documentation

## 2017-08-13 DIAGNOSIS — R002 Palpitations: Secondary | ICD-10-CM | POA: Insufficient documentation

## 2017-08-13 DIAGNOSIS — T43615A Adverse effect of caffeine, initial encounter: Secondary | ICD-10-CM | POA: Insufficient documentation

## 2017-08-13 DIAGNOSIS — R42 Dizziness and giddiness: Secondary | ICD-10-CM | POA: Insufficient documentation

## 2017-08-13 DIAGNOSIS — R0602 Shortness of breath: Secondary | ICD-10-CM | POA: Diagnosis not present

## 2017-08-13 DIAGNOSIS — Y829 Unspecified medical devices associated with adverse incidents: Secondary | ICD-10-CM | POA: Diagnosis not present

## 2017-08-13 DIAGNOSIS — R51 Headache: Secondary | ICD-10-CM | POA: Diagnosis not present

## 2017-08-13 DIAGNOSIS — I10 Essential (primary) hypertension: Secondary | ICD-10-CM | POA: Diagnosis not present

## 2017-08-13 DIAGNOSIS — R110 Nausea: Secondary | ICD-10-CM | POA: Insufficient documentation

## 2017-08-13 HISTORY — DX: Essential (primary) hypertension: I10

## 2017-08-13 LAB — CBC
HCT: 45.8 % (ref 39.0–52.0)
Hemoglobin: 14.8 g/dL (ref 13.0–17.0)
MCH: 28.7 pg (ref 26.0–34.0)
MCHC: 32.3 g/dL (ref 30.0–36.0)
MCV: 88.8 fL (ref 78.0–100.0)
PLATELETS: 464 10*3/uL — AB (ref 150–400)
RBC: 5.16 MIL/uL (ref 4.22–5.81)
RDW: 14.9 % (ref 11.5–15.5)
WBC: 10.3 10*3/uL (ref 4.0–10.5)

## 2017-08-13 LAB — TROPONIN I: Troponin I: <0.03 ng/mL (ref <0.03)

## 2017-08-13 LAB — BASIC METABOLIC PANEL
Anion gap: 10 (ref 5–15)
BUN: 14 mg/dL (ref 6–20)
CHLORIDE: 103 mmol/L (ref 101–111)
CO2: 27 mmol/L (ref 22–32)
CREATININE: 1.13 mg/dL (ref 0.61–1.24)
Calcium: 9.5 mg/dL (ref 8.9–10.3)
GFR calc Af Amer: >60 mL/min (ref >60)
GFR calc non Af Amer: >60 mL/min (ref >60)
GLUCOSE: 147 mg/dL — AB (ref 65–99)
Potassium: 4 mmol/L (ref 3.5–5.1)
Sodium: 140 mmol/L (ref 135–145)

## 2017-08-13 LAB — MAGNESIUM: Magnesium: 1.9 mg/dL (ref 1.7–2.4)

## 2017-08-13 NOTE — ED Triage Notes (Addendum)
Pt reports that he woke up dizzy, sob and chest felt as if it were fluttering. Pt feels some nausea and has HA. Pt reports he was playing keyboard at church this morning and symptoms seem to have gotten worse and he decided to come in. Pt feels light headed at this time. No chest pain. Reports burping and relieved pain

## 2017-08-13 NOTE — ED Provider Notes (Signed)
[facility] DEPT Provider Note   CSN: [id number] Arrival date & time: 08/13/17  0946     History   Chief Complaint Chief Complaint  Patient presents with  . Chest Pain    HPI Jeremy Mooney is a 36 y.o. male with history of anxiety and hypertension, woke this morning with intermittent episodes of fluttering sensation in his chest in association with mild nausea, headache and shortness of breath.  He went to church where he placed the keyboard and during this activity he started feeling worse, lightheaded and felt he could possibly pass out.  He does endorse similar symptoms in the past, typically under times of great stress, but has not felt this in several years.  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  He denies chest pain.  The fluttering sensation lasted for approximately 2 and half hours and has resolved prior to arrival here.he denies excess caffeine use.  Denies any stimulants including caffeine intake. He has no family history of cardiac disease.  No current PCP and therefore not treated for hypertension.  HPI  Past Medical History:  Diagnosis Date  . Anxiety   . Hypertension     There are no active problems to display for this patient.   History reviewed. No pertinent surgical history.     Home Medications    Prior to Admission medications   Not on File    Family History Family History  Problem Relation Age of Onset  . Cancer Father   . Bowel Disease Mother     Social History Social History  Substance Use Topics  . Smoking status: Never Smoker  . Smokeless tobacco: Never Used  . Alcohol use No     Allergies   Patient has no known allergies.   Review of Systems Review of Systems  Constitutional: Negative for fever.  HENT: Negative for congestion and sore throat.   Eyes: Negative.     Respiratory: Positive for shortness of breath. Negative for cough, chest tightness, wheezing and stridor.   Cardiovascular: Positive for palpitations. Negative for chest pain and leg swelling.  Gastrointestinal: Positive for nausea. Negative for abdominal pain.  Genitourinary: Negative.   Musculoskeletal: Negative for arthralgias, joint swelling and neck pain.  Skin: Negative.  Negative for rash and wound.  Neurological: Negative for dizziness, weakness, light-headedness, numbness and headaches.  Psychiatric/Behavioral: Negative.      Physical Exam Updated Vital Signs BP (!) 145/90   Pulse 74   Temp 98.1 F (36.7 C) (Oral)   Resp 10   Ht  (1.854 m)   Wt 130.2 kg (287 lb)   SpO2 97%   BMI 37.87 kg/m   Physical Exam  Constitutional: He appears well-developed and well-nourished.  HENT:  Head: Normocephalic and atraumatic.  Eyes: Conjunctivae are normal.  Neck: Normal range of motion.  Cardiovascular: Normal rate, regular rhythm, normal heart sounds and intact distal pulses.   Pulmonary/Chest: Effort normal and breath sounds normal. He has no wheezes.  No sob on exam, but takes an occasional deep "sighing" respiration during interview.  Abdominal: Soft. Bowel sounds are normal. There is no tenderness.  Musculoskeletal: Normal range of motion.  Neurological: He is alert.  Skin: Skin is warm and dry.  Psychiatric: He has a normal mood and affect.  Nursing note and vitals reviewed.  ED Treatments / Results  Labs (all labs ordered are listed, but only abnormal results are displayed) Labs Reviewed  BASIC METABOLIC PANEL - Abnormal; Notable for the following:       Result Value   Glucose, Bld 147 (*)    All other components within normal limits  CBC - Abnormal; Notable for the following:    Platelets 464 (*)    All other components within normal limits  TROPONIN I  MAGNESIUM    EKG  EKG Interpretation  Date/Time:  Sunday August 13 2017 10:13:41  EDT Ventricular Rate:  81 PR Interval:  156 QRS Duration: 92 QT Interval:  364 QTC Calculation: 422 R Axis:   75 Text Interpretation:  Normal sinus rhythm Nonspecific ST and T wave abnormality Abnormal ECG Confirmed by Crista Curb [phone]) on 08/13/2017 10:38:00 AM       Radiology Dg Chest 2 View  Result Date: 08/13/2017 CLINICAL DATA:  Chest pain and dizziness upon wakening. EXAM: CHEST  2 VIEW COMPARISON:  04/24/2007 FINDINGS: Heart size is normal. Mediastinal shadows are normal. The lungs are clear. No bronchial thickening. No infiltrate, mass, effusion or collapse. Pulmonary vascularity is normal. No bony abnormality. IMPRESSION: Normal chest Electronically Signed   By: Paulina Fusi M.D.   On: 08/13/2017 10:30    Procedures Procedures (including critical care time)  Medications Ordered in ED Medications - No data to display   Initial Impression / Assessment and Plan / ED Course  I have reviewed the triage vital signs and the nursing notes.  Pertinent labs & imaging results that were available during my care of the patient were reviewed by me and considered in my medical decision making (see chart for details).     Labs reviewed and discussed with patient. He had no sx since arriving in ed and while on the heart monitor.  He adds that he drank 4-5 cups of iced tea  last night around 7 pm which he does usually consume.  He has no complaint at this time except for fatigue.   Final Clinical Impressions(s) / ED Diagnoses   Final diagnoses:  Heart palpitations  Adverse effect of caffeine, initial encounter    New Prescriptions New Prescriptions   No medications on file     Victoriano Lain 08/13/17 1229    Lavera Guise, MD 08/13/17 1517

## 2017-08-13 NOTE — ED Notes (Signed)
Patient to xray.

## 2017-08-13 NOTE — Discharge Instructions (Signed)
Your lab tests,ekg and chest xray are reassuring and I suspect your symptoms may be related to your increased caffeine intake yesterday (probably in combination with your lack of sleep).  Your blood pressure is elevated today at 145/90 which is not a dangerous # but should get rechecked soon for consideration of blood pressure medicine if it remains elevated. I have given your referrals to establish care with a primary doctor.  Avoid caffeine (especially before bedtime) and try to work on getting better sleep.

## 2020-06-20 DIAGNOSIS — Z1152 Encounter for screening for COVID-19: Secondary | ICD-10-CM | POA: Diagnosis not present

## 2020-09-18 DIAGNOSIS — M531 Cervicobrachial syndrome: Secondary | ICD-10-CM | POA: Diagnosis not present

## 2020-09-18 DIAGNOSIS — M9903 Segmental and somatic dysfunction of lumbar region: Secondary | ICD-10-CM | POA: Diagnosis not present

## 2020-09-18 DIAGNOSIS — M5417 Radiculopathy, lumbosacral region: Secondary | ICD-10-CM | POA: Diagnosis not present

## 2020-09-18 DIAGNOSIS — M9905 Segmental and somatic dysfunction of pelvic region: Secondary | ICD-10-CM | POA: Diagnosis not present

## 2020-09-22 DIAGNOSIS — M5417 Radiculopathy, lumbosacral region: Secondary | ICD-10-CM | POA: Diagnosis not present

## 2020-09-22 DIAGNOSIS — M9903 Segmental and somatic dysfunction of lumbar region: Secondary | ICD-10-CM | POA: Diagnosis not present

## 2020-09-22 DIAGNOSIS — M9905 Segmental and somatic dysfunction of pelvic region: Secondary | ICD-10-CM | POA: Diagnosis not present

## 2020-09-22 DIAGNOSIS — M531 Cervicobrachial syndrome: Secondary | ICD-10-CM | POA: Diagnosis not present

## 2020-12-23 ENCOUNTER — Other Ambulatory Visit: Payer: Self-pay

## 2020-12-23 ENCOUNTER — Ambulatory Visit: Payer: BC Managed Care – PPO | Admitting: Family Medicine

## 2020-12-23 ENCOUNTER — Encounter: Payer: Self-pay | Admitting: Family Medicine

## 2020-12-23 VITALS — BP 140/84 | HR 84 | Temp 97.6°F | Ht 72.5 in | Wt 271.2 lb

## 2020-12-23 DIAGNOSIS — R3589 Other polyuria: Secondary | ICD-10-CM

## 2020-12-23 DIAGNOSIS — R81 Glycosuria: Secondary | ICD-10-CM

## 2020-12-23 DIAGNOSIS — E1169 Type 2 diabetes mellitus with other specified complication: Secondary | ICD-10-CM

## 2020-12-23 DIAGNOSIS — R030 Elevated blood-pressure reading, without diagnosis of hypertension: Secondary | ICD-10-CM

## 2020-12-23 DIAGNOSIS — E118 Type 2 diabetes mellitus with unspecified complications: Secondary | ICD-10-CM | POA: Diagnosis not present

## 2020-12-23 DIAGNOSIS — E1165 Type 2 diabetes mellitus with hyperglycemia: Secondary | ICD-10-CM

## 2020-12-23 DIAGNOSIS — IMO0002 Reserved for concepts with insufficient information to code with codable children: Secondary | ICD-10-CM

## 2020-12-23 DIAGNOSIS — E785 Hyperlipidemia, unspecified: Secondary | ICD-10-CM

## 2020-12-23 DIAGNOSIS — Z8042 Family history of malignant neoplasm of prostate: Secondary | ICD-10-CM | POA: Diagnosis not present

## 2020-12-23 LAB — TSH: TSH: 1.32 u[IU]/mL (ref 0.35–4.50)

## 2020-12-23 LAB — COMPREHENSIVE METABOLIC PANEL
ALT: 20 U/L (ref 0–53)
AST: 15 U/L (ref 0–37)
Albumin: 4.6 g/dL (ref 3.5–5.2)
Alkaline Phosphatase: 89 U/L (ref 39–117)
BUN: 11 mg/dL (ref 6–23)
CO2: 28 mEq/L (ref 19–32)
Calcium: 10 mg/dL (ref 8.4–10.5)
Chloride: 97 mEq/L (ref 96–112)
Creatinine, Ser: 1.13 mg/dL (ref 0.40–1.50)
GFR: 80.41 mL/min (ref >60.00)
Glucose, Bld: 337 mg/dL — ABNORMAL HIGH (ref 70–99)
Potassium: 5.1 mEq/L (ref 3.5–5.1)
Sodium: 134 mEq/L — ABNORMAL LOW (ref 135–145)
Total Bilirubin: 0.6 mg/dL (ref 0.2–1.2)
Total Protein: 7.7 g/dL (ref 6.0–8.3)

## 2020-12-23 LAB — LIPID PANEL
Cholesterol: 175 mg/dL (ref 0–200)
HDL: 29 mg/dL — ABNORMAL LOW (ref >39.00)
NonHDL: 145.81
Total CHOL/HDL Ratio: 6
Triglycerides: 241 mg/dL — ABNORMAL HIGH (ref 0.0–149.0)
VLDL: 48.2 mg/dL — ABNORMAL HIGH (ref 0.0–40.0)

## 2020-12-23 LAB — POCT GLYCOSYLATED HEMOGLOBIN (HGB A1C): Hemoglobin A1C: 12.8 % — AB (ref 4.0–5.6)

## 2020-12-23 LAB — POC URINALSYSI DIPSTICK (AUTOMATED)
Bilirubin, UA: NEGATIVE
Blood, UA: NEGATIVE
Glucose, UA: POSITIVE — AB
Ketones, UA: NEGATIVE
Leukocytes, UA: NEGATIVE
Nitrite, UA: NEGATIVE
Protein, UA: POSITIVE — AB
Spec Grav, UA: 1.015 (ref 1.010–1.025)
Urobilinogen, UA: 0.2 E.U./dL
pH, UA: 6 (ref 5.0–8.0)

## 2020-12-23 LAB — LDL CHOLESTEROL, DIRECT: Direct LDL: 99 mg/dL

## 2020-12-23 LAB — MICROALBUMIN / CREATININE URINE RATIO
Creatinine,U: 101.2 mg/dL
Microalb Creat Ratio: 2.9 mg/g (ref 0.0–30.0)
Microalb, Ur: 3 mg/dL — ABNORMAL HIGH (ref 0.0–1.9)

## 2020-12-23 LAB — PSA: PSA: 0.47 ng/mL (ref 0.10–4.00)

## 2020-12-23 MED ORDER — METFORMIN HCL 500 MG PO TABS: 500.0000 mg | ORAL_TABLET | Freq: Two times a day (BID) | ORAL | 1 refills | Status: DC

## 2020-12-23 NOTE — Patient Instructions (Addendum)
Urinalysis today.  Labs today.   Buy arm BP cuff to start monitoring blood pressures at home.  Your goal blood pressure is <140/90.  Work on low salt/sodium diet - goal <1.5gm (1,500mg ) per day. Eat a diet high in fruits/vegetables and whole grains.  Look into mediterranean and DASH diet.  Goal activity is 14min/wk of moderate intensity exercise.  This can be split into 30 minute chunks. If you are not at this level, you can start with smaller 10-15 min increments and slowly build up activity. Look at www.heart.org for more resources   A1c (average sugar control over 3 months) returned in uncontrolled diabetes range.  We will refer you to diabetes classes  Start metformin 500mg  daily, increase to twice daily after 1 week Work on low sugar low carb diabetic diet, handout provided.  Return at your convenience in 6 weeks for diabetes follow up and/or physical.   Diabetes Mellitus and Nutrition, Adult When you have diabetes, or diabetes mellitus, it is very important to have healthy eating habits because your blood sugar (glucose) levels are greatly affected by what you eat and drink. Eating healthy foods in the right amounts, at about the same times every day, can help you:  Control your blood glucose.  Lower your risk of heart disease.  Improve your blood pressure.  Reach or maintain a healthy weight. What can affect my meal plan? Every person with diabetes is different, and each person has different needs for a meal plan. Your health care provider may recommend that you work with a dietitian to make a meal plan that is best for you. Your meal plan may vary depending on factors such as:  The calories you need.  The medicines you take.  Your weight.  Your blood glucose, blood pressure, and cholesterol levels.  Your activity level.  Other health conditions you have, such as heart or kidney disease. How do carbohydrates affect me? Carbohydrates, also called carbs, affect your  blood glucose level more than any other type of food. Eating carbs naturally raises the amount of glucose in your blood. Carb counting is a method for keeping track of how many carbs you eat. Counting carbs is important to keep your blood glucose at a healthy level, especially if you use insulin or take certain oral diabetes medicines. It is important to know how many carbs you can safely have in each meal. This is different for every person. Your dietitian can help you calculate how many carbs you should have at each meal and for each snack. How does alcohol affect me? Alcohol can cause a sudden decrease in blood glucose (hypoglycemia), especially if you use insulin or take certain oral diabetes medicines. Hypoglycemia can be a life-threatening condition. Symptoms of hypoglycemia, such as sleepiness, dizziness, and confusion, are similar to symptoms of having too much alcohol.  Do not drink alcohol if: ? Your health care provider tells you not to drink. ? You are pregnant, may be pregnant, or are planning to become pregnant.  If you drink alcohol: ? Do not drink on an empty stomach. ? Limit how much you use to:  0-1 drink a day for women.  0-2 drinks a day for men. ? Be aware of how much alcohol is in your drink. In the [place] one drink equals one 12 oz bottle of beer (355 mL), one 5 oz glass of wine (148 mL), or one 1 oz glass of hard liquor (44 mL). ? Keep yourself hydrated with water, diet  soda, or unsweetened iced tea.  Keep in mind that regular soda, juice, and other mixers may contain a lot of sugar and must be counted as carbs. What are tips for following this plan? Reading food labels  Start by checking the serving size on the "Nutrition Facts" label of packaged foods and drinks. The amount of calories, carbs, fats, and other nutrients listed on the label is based on one serving of the item. Many items contain more than one serving per package.  Check the total grams (g) of carbs in  one serving. You can calculate the number of servings of carbs in one serving by dividing the total carbs by 15. For example, if a food has 30 g of total carbs per serving, it would be equal to 2 servings of carbs.  Check the number of grams (g) of saturated fats and trans fats in one serving. Choose foods that have a low amount or none of these fats.  Check the number of milligrams (mg) of salt (sodium) in one serving. Most people should limit total sodium intake to less than 2,300 mg per day.  Always check the nutrition information of foods labeled as "low-fat" or "nonfat." These foods may be higher in added sugar or refined carbs and should be avoided.  Talk to your dietitian to identify your daily goals for nutrients listed on the label. Shopping  Avoid buying canned, pre-made, or processed foods. These foods tend to be high in fat, sodium, and added sugar.  Shop around the outside edge of the grocery store. This is where you will most often find fresh fruits and vegetables, bulk grains, fresh meats, and fresh dairy. Cooking  Use low-heat cooking methods, such as baking, instead of high-heat cooking methods like deep frying.  Cook using healthy oils, such as olive, canola, or sunflower oil.  Avoid cooking with butter, cream, or high-fat meats. Meal planning  Eat meals and snacks regularly, preferably at the same times every day. Avoid going long periods of time without eating.  Eat foods that are high in fiber, such as fresh fruits, vegetables, beans, and whole grains. Talk with your dietitian about how many servings of carbs you can eat at each meal.  Eat 4-6 oz (112-168 g) of lean protein each day, such as lean meat, chicken, fish, eggs, or tofu. One ounce (oz) of lean protein is equal to: ? 1 oz (28 g) of meat, chicken, or fish. ? 1 egg. ?  cup (62 g) of tofu.  Eat some foods each day that contain healthy fats, such as avocado, nuts, seeds, and fish.   What foods should I  eat? Fruits Berries. Apples. Oranges. Peaches. Apricots. Plums. Grapes. Mango. Papaya. Pomegranate. Kiwi. Cherries. Vegetables Lettuce. Spinach. Leafy greens, including kale, chard, collard greens, and mustard greens. Beets. Cauliflower. Cabbage. Broccoli. Carrots. Green beans. Tomatoes. Peppers. Onions. Cucumbers. Brussels sprouts. Grains Whole grains, such as whole-wheat or whole-grain bread, crackers, tortillas, cereal, and pasta. Unsweetened oatmeal. Quinoa. Brown or wild rice. Meats and other proteins Seafood. Poultry without skin. Lean cuts of poultry and beef. Tofu. Nuts. Seeds. Dairy Low-fat or fat-free dairy products such as milk, yogurt, and cheese. The items listed above may not be a complete list of foods and beverages you can eat. Contact a dietitian for more information. What foods should I avoid? Fruits Fruits canned with syrup. Vegetables Canned vegetables. Frozen vegetables with butter or cream sauce. Grains Refined white flour and flour products such as bread, pasta, snack foods,  and cereals. Avoid all processed foods. Meats and other proteins Fatty cuts of meat. Poultry with skin. Breaded or fried meats. Processed meat. Avoid saturated fats. Dairy Full-fat yogurt, cheese, or milk. Beverages Sweetened drinks, such as soda or iced tea. The items listed above may not be a complete list of foods and beverages you should avoid. Contact a dietitian for more information. Questions to ask a health care provider  Do I need to meet with a diabetes educator?  Do I need to meet with a dietitian?  What number can I call if I have questions?  When are the best times to check my blood glucose? Where to find more information:  American Diabetes Association: diabetes.org  Academy of Nutrition and Dietetics: www.eatright.[employer] of Diabetes and Digestive and Kidney Diseases: CarFlippers.tn  Association of Diabetes Care and Education Specialists:  www.diabeteseducator.org Summary  It is important to have healthy eating habits because your blood sugar (glucose) levels are greatly affected by what you eat and drink.  A healthy meal plan will help you control your blood glucose and maintain a healthy lifestyle.  Your health care provider may recommend that you work with a dietitian to make a meal plan that is best for you.  Keep in mind that carbohydrates (carbs) and alcohol have immediate effects on your blood glucose levels. It is important to count carbs and to use alcohol carefully. This information is not intended to replace advice given to you by your health care provider. Make sure you discuss any questions you have with your health care provider. Document Revised: 10/01/2019 Document Reviewed: 10/01/2019 Elsevier Patient Education  2021 [employer].

## 2020-12-23 NOTE — Progress Notes (Signed)
Patient ID: Jeremy Mooney, male    DOB: [birth year], 40 y.o.   MRN: [id number]  This visit was conducted in person.  BP 140/84   Pulse 84   Temp 97.6 F (36.4 C) (Temporal)   Ht 6' 0.5" (1.842 m)   Wt 271 lb 4 oz (123 kg)   SpO2 98%   BMI 36.28 kg/m   BP Readings from Last 3 Encounters:  12/23/20 140/84  08/13/17 (!) 140/92  02/20/16 152/89   CC: new pt to establish care  Subjective:   HPI: Jeremy Mooney is a 40 y.o. male presenting on 12/23/2020 for New Patient (Initial Visit)   "Roslynn AmbleLegazzi" I see patient's wife Tiffany. He is a Environmental managerphotographer.  Previously seen by [facility].  Due for CPE.   Elevated BP - high readings in the past. Has changed diet, increased exercise biking regularly (10 mi at least 2x/wk).   Obesity - 15+ lb weight loss over the past year. Quit job 05/2020 (3rd shift) - healthier diet since then.   Last summer noted increased urination. Initially improved but now over the past month this has recurred - nocturia x4. Also notes increased urgency. Treating with apple and cranberry juice. Notes increased thirst, no increased hunger. No abd pain, dysuria, hematuria. No strong fmhx diabetes. Father and uncle had prostate cancer, dx age 4860s.   Preventative: Colon cancer - none in family Prostate cancer - fmhx (father and uncle) - will start screening.  Flu shot - declines  COVID vaccine [employer] x2, booster 10/19/2020  Lives with wife Tiffany, 2 children at home, 1 child with ex wife Occ: Network engineerwner, [place] [employer]: [employer] Activity: biking regularly 10mi 2x/wk Diet: good water, fruits/vegetables daily     Relevant past medical, surgical, family and social history reviewed and updated as indicated. Interim medical history since our last visit reviewed. Allergies and medications reviewed and updated. No outpatient medications prior to visit.   No facility-administered medications prior to visit.     Per HPI unless specifically indicated in ROS section  below Review of Systems Objective:  BP 140/84   Pulse 84   Temp 97.6 F (36.4 C) (Temporal)   Ht 6' 0.5" (1.842 m)   Wt 271 lb 4 oz (123 kg)   SpO2 98%   BMI 36.28 kg/m   Wt Readings from Last 3 Encounters:  12/23/20 271 lb 4 oz (123 kg)  08/13/17 287 lb (130.2 kg)  07/02/15 286 lb (129.7 kg)      Physical Exam Vitals and nursing note reviewed.  Constitutional:      Appearance: Normal appearance. He is not ill-appearing.  Eyes:     Extraocular Movements: Extraocular movements intact.     Conjunctiva/sclera: Conjunctivae normal.     Pupils: Pupils are equal, round, and reactive to light.  Neck:     Thyroid: No thyroid mass or thyromegaly.  Cardiovascular:     Rate and Rhythm: Normal rate and regular rhythm.     Pulses: Normal pulses.     Heart sounds: Normal heart sounds. No murmur heard.   Pulmonary:     Effort: Pulmonary effort is normal. No respiratory distress.     Breath sounds: Normal breath sounds. No wheezing, rhonchi or rales.  Abdominal:     General: Abdomen is flat. Bowel sounds are normal. There is no distension.     Palpations: Abdomen is soft. There is no mass.     Tenderness: There is no abdominal tenderness. There is no right  CVA tenderness, left CVA tenderness, guarding or rebound.     Hernia: No hernia is present.  Musculoskeletal:        General: Normal range of motion.     Cervical back: Normal range of motion and neck supple. No rigidity.     Right lower leg: No edema.     Left lower leg: No edema.  Lymphadenopathy:     Cervical: No cervical adenopathy.  Skin:    General: Skin is warm and dry.     Findings: No rash.  Neurological:     Mental Status: He is alert.  Psychiatric:        Mood and Affect: Mood normal.        Behavior: Behavior normal.       Results for orders placed or performed in visit on 12/23/20  Lipid panel  Result Value Ref Range   Cholesterol 175 0 - 200 mg/dL   Triglycerides 229.7 (H) 0.0 - 149.0 mg/dL   HDL 98.92  (L) >11.94 mg/dL   VLDL 17.4 (H) 0.0 - 08.1 mg/dL   Total CHOL/HDL Ratio 6    NonHDL 145.81   Comprehensive metabolic panel  Result Value Ref Range   Sodium 134 (L) 135 - 145 mEq/L   Potassium 5.1 3.5 - 5.1 mEq/L   Chloride 97 96 - 112 mEq/L   CO2 28 19 - 32 mEq/L   Glucose, Bld 337 (H) 70 - 99 mg/dL   BUN 11 6 - 23 mg/dL   Creatinine, Ser 4.48 0.40 - 1.50 mg/dL   Total Bilirubin 0.6 0.2 - 1.2 mg/dL   Alkaline Phosphatase 89 39 - 117 U/L   AST 15 0 - 37 U/L   ALT 20 0 - 53 U/L   Total Protein 7.7 6.0 - 8.3 g/dL   Albumin 4.6 3.5 - 5.2 g/dL   GFR 18.56 >31.49 mL/min   Calcium 10.0 8.4 - 10.5 mg/dL  TSH  Result Value Ref Range   TSH 1.32 0.35 - 4.50 uIU/mL  PSA  Result Value Ref Range   PSA 0.47 0.10 - 4.00 ng/mL  Microalbumin / creatinine urine ratio  Result Value Ref Range   Microalb, Ur 3.0 (H) 0.0 - 1.9 mg/dL   Creatinine,U 702.6 mg/dL   Microalb Creat Ratio 2.9 0.0 - 30.0 mg/g  LDL cholesterol, direct  Result Value Ref Range   Direct LDL 99.0 mg/dL  POCT Urinalysis Dipstick (Automated)  Result Value Ref Range   Color, UA yellow    Clarity, UA clear    Glucose, UA Positive (A) Negative   Bilirubin, UA negative    Ketones, UA negative    Spec Grav, UA 1.015 1.010 - 1.025   Blood, UA negative    pH, UA 6.0 5.0 - 8.0   Protein, UA Positive (A) Negative   Urobilinogen, UA 0.2 0.2 or 1.0 E.U./dL   Nitrite, UA negative    Leukocytes, UA Negative Negative  POCT glycosylated hemoglobin (Hb A1C)  Result Value Ref Range   Hemoglobin A1C 12.8 (A) 4.0 - 5.6 %   HbA1c POC (<> result, manual entry)     HbA1c, POC (prediabetic range)     HbA1c, POC (controlled diabetic range)     Assessment & Plan:  This visit occurred during the SARS-CoV-2 public health emergency.  Safety protocols were in place, including screening questions prior to the visit, additional usage of staff PPE, and extensive cleaning of exam room while observing appropriate contact time as indicated for  disinfecting solutions.   Problem List Items Addressed This Visit    Severe obesity (BMI 35.0-39.9) with comorbidity (HCC)    Encouraged healthy diet and lifestyle choices to affect sustainable weight loss. Reviewed relation of obesity to diabetes and insulin resistance.       Relevant Medications   metFORMIN (GLUCOPHAGE) 500 MG tablet   Other Relevant Orders   Lipid panel (Completed)   Comprehensive metabolic panel (Completed)   TSH (Completed)   Polyuria    Increased polyuria for the past ~46mo associated with polydipsia. ?diabetes vs BPH vs other. Check UA today. Check labs today.       Relevant Orders   POCT Urinalysis Dipstick (Automated) (Completed)   Family history of prostate cancer in father    Check PSA.       Relevant Orders   PSA (Completed)   Elevated blood pressure reading without diagnosis of hypertension    H/o elevated bp readings in the past. High normal in office today - discussed pre-hypertension, rec low salt/sodium diet, increased water, work on weight loss, and buy BP cuff to start monitoring blood pressures at home. Update [place] if consistently >140/90 at home.       Diabetes mellitus type 2, uncontrolled, with complications (HCC) - Primary    New diagnosis. Story consistent with type 2 diabetes.  Reviewed pathophysiology of type 1 vs 2 diabetes. Reviewed dietary recommendations as well as recommended starting metformin, mechanism of action, reviewing common side effects.  Refer to diabetes education.  Diabetes management handout provided today.  I've asked him to check on insurance preferred glucometer brand and I will send in for him.  Would be good candidate for weekly GLP1 RA.  RTC 4-6 wks close f/u, check fructosamine at that time.       Relevant Medications   metFORMIN (GLUCOPHAGE) 500 MG tablet   Other Relevant Orders   Ambulatory referral to diabetic education    Other Visit Diagnoses    Glucosuria       Relevant Orders   Microalbumin /  creatinine urine ratio (Completed)   POCT glycosylated hemoglobin (Hb A1C) (Completed)       Meds ordered this encounter  Medications  . metFORMIN (GLUCOPHAGE) 500 MG tablet    Sig: Take 1 tablet (500 mg total) by mouth 2 (two) times daily with a meal. Take once daily with breakfast for the first week    Dispense:  180 tablet    Refill:  1   Orders Placed This Encounter  Procedures  . Lipid panel  . Comprehensive metabolic panel  . TSH  . PSA  . Microalbumin / creatinine urine ratio  . LDL cholesterol, direct  . Ambulatory referral to diabetic education    Referral Priority:   Routine    Referral Type:   Consultation    Referral Reason:   Specialty Services Required    Number of Visits Requested:   1  . POCT Urinalysis Dipstick (Automated)  . POCT glycosylated hemoglobin (Hb A1C)    Patient instructions: Urinalysis today.  Labs today.   Buy arm BP cuff to start monitoring blood pressures at home.  Your goal blood pressure is <140/90.  Work on low salt/sodium diet - goal <1.5gm (1,500mg ) per day. Eat a diet high in fruits/vegetables and whole grains.  Look into mediterranean and DASH diet.  Goal activity is 168min/wk of moderate intensity exercise.  This can be split into 30 minute chunks. If you are not at this level, you can  start with smaller 10-15 min increments and slowly build up activity. Look at www.heart.org for more resources   A1c (average sugar control over 3 months) returned in uncontrolled diabetes range.  We will refer you to diabetes classes  Start metformin 500mg  daily, increase to twice daily after 1 week Work on low sugar low carb diabetic diet, handout provided.  Return at your convenience in 6 weeks for diabetes follow up and/or physical.   Follow up plan: Return in 6 months (on 06/22/2021), or if symptoms worsen or fail to improve, for annual exam, prior fasting for blood work.  06/24/2021, MD

## 2020-12-23 NOTE — Assessment & Plan Note (Addendum)
Encouraged healthy diet and lifestyle choices to affect sustainable weight loss. Reviewed relation of obesity to diabetes and insulin resistance.

## 2020-12-23 NOTE — Assessment & Plan Note (Signed)
Increased polyuria for the past ~30mo associated with polydipsia. ?diabetes vs BPH vs other. Check UA today. Check labs today.

## 2020-12-23 NOTE — Assessment & Plan Note (Signed)
H/o elevated bp readings in the past. High normal in office today - discussed pre-hypertension, rec low salt/sodium diet, increased water, work on weight loss, and buy BP cuff to start monitoring blood pressures at home. Update [place] if consistently >140/90 at home.

## 2020-12-24 ENCOUNTER — Encounter: Payer: Self-pay | Admitting: Family Medicine

## 2020-12-24 DIAGNOSIS — E1169 Type 2 diabetes mellitus with other specified complication: Secondary | ICD-10-CM | POA: Insufficient documentation

## 2020-12-24 DIAGNOSIS — IMO0002 Reserved for concepts with insufficient information to code with codable children: Secondary | ICD-10-CM | POA: Insufficient documentation

## 2020-12-24 DIAGNOSIS — E1165 Type 2 diabetes mellitus with hyperglycemia: Secondary | ICD-10-CM | POA: Insufficient documentation

## 2020-12-24 NOTE — Assessment & Plan Note (Signed)
Check PSA. ?

## 2020-12-24 NOTE — Assessment & Plan Note (Signed)
New diagnosis. Story consistent with type 2 diabetes.  Reviewed pathophysiology of type 1 vs 2 diabetes. Reviewed dietary recommendations as well as recommended starting metformin, mechanism of action, reviewing common side effects.  Refer to diabetes education.  Diabetes management handout provided today.  I've asked him to check on insurance preferred glucometer brand and I will send in for him.  Would be good candidate for weekly GLP1 RA.  RTC 4-6 wks close f/u, check fructosamine at that time.

## 2021-01-01 ENCOUNTER — Encounter: Payer: BC Managed Care – PPO | Attending: Family Medicine | Admitting: *Deleted

## 2021-01-01 ENCOUNTER — Encounter: Payer: Self-pay | Admitting: *Deleted

## 2021-01-01 ENCOUNTER — Other Ambulatory Visit: Payer: Self-pay

## 2021-01-01 VITALS — BP 136/80 | Ht 73.0 in | Wt 274.3 lb

## 2021-01-01 DIAGNOSIS — E119 Type 2 diabetes mellitus without complications: Secondary | ICD-10-CM

## 2021-01-01 DIAGNOSIS — E1165 Type 2 diabetes mellitus with hyperglycemia: Secondary | ICD-10-CM | POA: Insufficient documentation

## 2021-01-01 NOTE — Patient Instructions (Signed)
Check blood sugars before breakfast and 2 hrs after one meal 3 x week Bring blood sugar records to the next appointment/class  Call your doctor for a prescription for:  1. Meter strips (type) Contour Next  checking  3     times per week  2. Lancets (type) Contour Microlet checking  3     times per week  Exercise: Continue cycling  for    30-45  minutes 3  days a week  Eat 3 meals day, 1-2 snacks a day Space meals 4-6 hours apart Avoid sugar sweetened drinks (sports drinks, juices)  Call back if you want to schedule Diabetes classes or an appointment with the nurse or dietitian

## 2021-01-01 NOTE — Progress Notes (Signed)
Diabetes Self-Management Education  Visit Type: First/Initial  Appt. Start Time: 0840 Appt. End Time: 0950  01/01/2021  Mr. Jeremy Mooney, identified by name and date of birth, is a 40 y.o. male with a diagnosis of Diabetes: Type 2.   ASSESSMENT  Blood pressure 136/80, height 6\' 1"  (1.854 m), weight 274 lb 4.8 oz (124.4 kg). Body mass index is 36.19 kg/m.   Diabetes Self-Management Education - 01/01/21 1016      Visit Information   Visit Type First/Initial      Initial Visit   Diabetes Type Type 2    Are you currently following a meal plan? Yes    What type of meal plan do you follow? "cut out fast foods and sweet juices"    Are you taking your medications as prescribed? Yes    Date Diagnosed 2 weeks ago      Health Coping   How would you rate your overall health? Fair      Psychosocial Assessment   Patient Belief/Attitude about Diabetes Other (comment)   "sad but determined at the same time"   Self-care barriers None    Self-management support Doctor's office;Family    Patient Concerns Nutrition/Meal planning;Glycemic Control;Medication;Weight Control;Healthy Lifestyle    Special Needs None    Preferred Learning Style Hands on    Learning Readiness Change in progress    How often do you need to have someone help you when you read instructions, pamphlets, or other written materials from your doctor or pharmacy? 1 - Never    What is the last grade level you completed in school? high school      Pre-Education Assessment   Patient understands the diabetes disease and treatment process. Needs Instruction    Patient understands incorporating nutritional management into lifestyle. Needs Instruction    Patient undertands incorporating physical activity into lifestyle. Needs Review    Patient understands using medications safely. Needs Instruction    Patient understands monitoring blood glucose, interpreting and using results Needs Instruction    Patient understands prevention,  detection, and treatment of acute complications. Needs Instruction    Patient understands prevention, detection, and treatment of chronic complications. Needs Instruction    Patient understands how to develop strategies to address psychosocial issues. Needs Instruction    Patient understands how to develop strategies to promote health/change behavior. Needs Instruction      Complications   Last HgB A1C per patient/outside source 12.8 %   12/23/2020   How often do you check your blood sugar? 0 times/day (not testing)   Provided Contour Next EZ meter and instructed on use. BG upon return demonstration was 122 mg/dL at 12/25/2020 am - fasting.   Have you had a dilated eye exam in the past 12 months? Yes    Have you had a dental exam in the past 12 months? No    Are you checking your feet? Yes    How many days per week are you checking your feet? 1      Dietary Intake   Breakfast intermittent fasting - has been going to [employer] or [employer] almost daily    Lunch 4:09 sausage in spinach wrap; oatmeal and fruit (apples, strawberries) with cinnamon and agave    Snack (afternoon) 1 snack/day - chips, grapes    Dinner chicken, pork, fish, shrimp, rarely beef; potatoes, peas, beans, corn, green beans, rice, pasta, broccoli, cauliflower, tomatoes, carrots, cuccumbers, cabbage, onions, peppers    Beverage(s) water, sports drinks, juices  Exercise   Exercise Type Moderate (swimming / aerobic walking)   cycling   How many days per week to you exercise? 3    How many minutes per day do you exercise? 37.5    Total minutes per week of exercise 112.5      Patient Education   Previous Diabetes Education No    Disease state  Definition of diabetes, type 1 and 2, and the diagnosis of diabetes;Factors that contribute to the development of diabetes    Nutrition management  Role of diet in the treatment of diabetes and the relationship between the three main macronutrients and blood glucose level;Food label  reading, portion sizes and measuring food.;Reviewed blood glucose goals for pre and post meals and how to evaluate the patients' food intake on their blood glucose level.    Physical activity and exercise  Role of exercise on diabetes management, blood pressure control and cardiac health.    Medications Reviewed patients medication for diabetes, action, purpose, timing of dose and side effects.    Monitoring Taught/evaluated SMBG meter.;Purpose and frequency of SMBG.;Taught/discussed recording of test results and interpretation of SMBG.;Identified appropriate SMBG and/or A1C goals.    Chronic complications Relationship between chronic complications and blood glucose control    Psychosocial adjustment Role of stress on diabetes;Identified and addressed patients feelings and concerns about diabetes      Individualized Goals (developed by patient)   Reducing Risk Other (comment)   improve blood sugars, decrease medications, lose weight, lead a healthier lifestyle     Outcomes   Expected Outcomes Demonstrated interest in learning. Expect positive outcomes           Individualized Plan for Diabetes Self-Management Training:   Learning Objective:  Patient will have a greater understanding of diabetes self-management. Patient education plan is to attend individual and/or group sessions per assessed needs and concerns.   Plan:   Patient Instructions  Check blood sugars before breakfast and 2 hrs after one meal 3 x week Bring blood sugar records to the next appointment/class  Call your doctor for a prescription for:  1. Meter strips (type) Contour Next  checking  3     times per week  2. Lancets (type) Contour Microlet checking  3     times per week  Exercise: Continue cycling  for    30-45  minutes 3  days a week  Eat 3 meals day, 1-2 snacks a day Space meals 4-6 hours apart Avoid sugar sweetened drinks (sports drinks, juices)  Call back if you want to schedule Diabetes classes or an  appointment with the nurse or dietitian   Expected Outcomes:  Demonstrated interest in learning. Expect positive outcomes  Education material provided:  General Meal Planning Guidelines Simple Meal Plan Meter - Contour Next EZ  If problems or questions, patient to contact team via:  Sharion Settler, RN, CCM, CDCES [phone]  Future DSME appointment:  Patient will check with his insurance plan to determine if he will be able to attend classes or follow-up appointment with the nurse or dietitian.

## 2021-02-03 ENCOUNTER — Ambulatory Visit: Payer: BC Managed Care – PPO | Admitting: Family Medicine

## 2021-02-03 ENCOUNTER — Encounter: Payer: Self-pay | Admitting: Family Medicine

## 2021-02-03 ENCOUNTER — Other Ambulatory Visit: Payer: Self-pay

## 2021-02-03 ENCOUNTER — Encounter: Payer: Self-pay | Admitting: *Deleted

## 2021-02-03 VITALS — BP 132/78 | HR 82 | Temp 97.7°F | Ht 73.0 in | Wt 269.4 lb

## 2021-02-03 DIAGNOSIS — E118 Type 2 diabetes mellitus with unspecified complications: Secondary | ICD-10-CM | POA: Diagnosis not present

## 2021-02-03 DIAGNOSIS — IMO0002 Reserved for concepts with insufficient information to code with codable children: Secondary | ICD-10-CM

## 2021-02-03 DIAGNOSIS — R030 Elevated blood-pressure reading, without diagnosis of hypertension: Secondary | ICD-10-CM | POA: Diagnosis not present

## 2021-02-03 DIAGNOSIS — M2142 Flat foot [pes planus] (acquired), left foot: Secondary | ICD-10-CM

## 2021-02-03 DIAGNOSIS — M2141 Flat foot [pes planus] (acquired), right foot: Secondary | ICD-10-CM

## 2021-02-03 DIAGNOSIS — B353 Tinea pedis: Secondary | ICD-10-CM

## 2021-02-03 DIAGNOSIS — E1169 Type 2 diabetes mellitus with other specified complication: Secondary | ICD-10-CM | POA: Diagnosis not present

## 2021-02-03 DIAGNOSIS — E1165 Type 2 diabetes mellitus with hyperglycemia: Secondary | ICD-10-CM

## 2021-02-03 DIAGNOSIS — E785 Hyperlipidemia, unspecified: Secondary | ICD-10-CM

## 2021-02-03 DIAGNOSIS — M2140 Flat foot [pes planus] (acquired), unspecified foot: Secondary | ICD-10-CM | POA: Insufficient documentation

## 2021-02-03 LAB — BASIC METABOLIC PANEL
BUN: 13 mg/dL (ref 6–23)
CO2: 27 mEq/L (ref 19–32)
Calcium: 9.9 mg/dL (ref 8.4–10.5)
Chloride: 103 mEq/L (ref 96–112)
Creatinine, Ser: 1.16 mg/dL (ref 0.40–1.50)
GFR: 77.86 mL/min (ref >60.00)
Glucose, Bld: 104 mg/dL — ABNORMAL HIGH (ref 70–99)
Potassium: 4.1 mEq/L (ref 3.5–5.1)
Sodium: 139 mEq/L (ref 135–145)

## 2021-02-03 MED ORDER — ATORVASTATIN CALCIUM 20 MG PO TABS: 20.0000 mg | ORAL_TABLET | Freq: Every day | ORAL | 3 refills | Status: AC

## 2021-02-03 MED ORDER — METFORMIN HCL ER 500 MG PO TB24: 1000.0000 mg | ORAL_TABLET | Freq: Every evening | ORAL | 1 refills | Status: DC

## 2021-02-03 NOTE — Assessment & Plan Note (Signed)
Start atorvastatin 20mg  in diabetic. Monitor for myalgias, if they develop then drop dose to MWF  The 10-year ASCVD risk score DC Jeremy Mooney., et al., 2013) is: 7.8%   Values used to calculate the score:     Age: 40 years     Sex: Male     Is Non-Hispanic African American: Yes     Diabetic: Yes     Tobacco smoker: No     Systolic Blood Pressure: 132 mmHg     Is BP treated: No     HDL Cholesterol: 29 mg/dL     Total Cholesterol: 175 mg/dL

## 2021-02-03 NOTE — Assessment & Plan Note (Signed)
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 Pt asxs. Discussed possible eval by sports med ([facility]) to consider custom orthotics.

## 2021-02-03 NOTE — Patient Instructions (Addendum)
Labs today  Change metformin to extended release (XR) - take 2 500mg  tablets at night time.  Start cholesterol medicine atorvastatin 20mg  daily - sent to pharmacy.  Schedule diabetic eye exam at your convenience, have them send me report.  Consider pneumovax pneumonia shot - recommended in all diabetics.  For breakdown between toes - make sure to pat dry after shower, use lotrimin antifungal cream daily to areas for 3-4 weeks.  Return as needed or in 2-3 months for physical

## 2021-02-03 NOTE — Assessment & Plan Note (Addendum)
Anticipate marked improvement based on log of sugars he brings. Reviewed healthy diet and lifestyle changes to date - congratulated! Check fructosamine levels today as well as BMP. Continue sustainable changes to date.  Some GI upset with IR metformin - will trial XR metformin instead 1000mg  nightly. Update with effect.  Discussed pneumovax - declines for now.  RTC 2-3 mo CPE

## 2021-02-03 NOTE — Progress Notes (Signed)
Patient ID: Jeremy Mooney, male    DOB: [birth year], 40 y.o.   MRN: [id number]  This visit was conducted in person.  BP 132/78   Pulse 82   Temp 97.7 F (36.5 C) (Temporal)   Ht 6\' 1"  (1.854 m)   Wt 269 lb 6 oz (122.2 kg)   SpO2 97%   BMI 35.54 kg/m    CC: 4-6 wk DM f/u visit  Subjective:   HPI: Jeremy Mooney is a 40 y.o. male presenting on 02/03/2021 for Diabetes (Here for 4-6 wk f/u.)   Significant lifestyle changes - riding bike more, has cut out most sugar, cooking at home more. 5 lb weight loss. Has cut out juices, eating more fruits. Has started intermittent fasting - first meal of the day is 12 pm.   DM - does regularly check sugars and brings log: fasting 100-130, postprandial after dinner 90-120s. Compliant with antihyperglycemic regimen which includes: metformin 500mg  bid. Occasional loose stools. Denies low sugars or hypoglycemic symptoms. Denies paresthesias. Last diabetic eye exam DUE. Pneumovax: DUE. Prevnar: not due. Glucometer brand: Contour - needs Rx. DSME: [facility] 12/2020. Lab Results  Component Value Date   HGBA1C 12.8 (A) 12/23/2020   Diabetic Foot Exam - Simple   Simple Foot Form Diabetic Foot exam was performed with the following findings: Yes 02/03/2021 10:24 AM  Visual Inspection See comments: Yes Sensation Testing Intact to touch and monofilament testing bilaterally: Yes Pulse Check Posterior Tibialis and Dorsalis pulse intact bilaterally: Yes Comments Scaling of bilateral soles  Maceration R>L interdigital space between 4th/5th digits Marked pes planus with loss of longitudinal arch    Lab Results  Component Value Date   MICROALBUR 3.0 (H) 12/23/2020     H/o remote R ankle fracture, longstanding h/o flat foot without discomfort     Relevant past medical, surgical, family and social history reviewed and updated as indicated. Interim medical history since our last visit reviewed. Allergies and medications reviewed and  updated. Outpatient Medications Prior to Visit  Medication Sig Dispense Refill  . APPLE CIDER VINEGAR PO Take 2 capsules by mouth daily.    . metFORMIN (GLUCOPHAGE) 500 MG tablet Take 1 tablet (500 mg total) by mouth 2 (two) times daily with a meal. Take once daily with breakfast for the first week 180 tablet 1   No facility-administered medications prior to visit.     Per HPI unless specifically indicated in ROS section below Review of Systems Objective:  BP 132/78   Pulse 82   Temp 97.7 F (36.5 C) (Temporal)   Ht 6\' 1"  (1.854 m)   Wt 269 lb 6 oz (122.2 kg)   SpO2 97%   BMI 35.54 kg/m   Wt Readings from Last 3 Encounters:  02/03/21 269 lb 6 oz (122.2 kg)  01/01/21 274 lb 4.8 oz (124.4 kg)  12/23/20 271 lb 4 oz (123 kg)      Physical Exam Vitals and nursing note reviewed.  Constitutional:      General: He is not in acute distress.    Appearance: Normal appearance. He is well-developed. He is obese. He is not ill-appearing.  HENT:     Head: Normocephalic and atraumatic.  Eyes:     General: No scleral icterus.    Extraocular Movements: Extraocular movements intact.     Conjunctiva/sclera: Conjunctivae normal.     Pupils: Pupils are equal, round, and reactive to light.  Cardiovascular:     Rate and Rhythm: Normal rate and regular rhythm.  Pulses: Normal pulses.     Heart sounds: Normal heart sounds. No murmur heard.   Pulmonary:     Effort: Pulmonary effort is normal. No respiratory distress.     Breath sounds: Normal breath sounds. No wheezing, rhonchi or rales.  Musculoskeletal:     Cervical back: Normal range of motion and neck supple.     Right lower leg: No edema.     Left lower leg: No edema.     Comments:  See HPI for foot exam if done Marked pes planus with loss of longitudinal arches, medial ankle collapse. L dorsal midfoot with bony cyst prominence.   Lymphadenopathy:     Cervical: No cervical adenopathy.  Skin:    General: Skin is warm and dry.      Findings: No rash.  Neurological:     Mental Status: He is alert.  Psychiatric:        Mood and Affect: Mood normal.        Behavior: Behavior normal.       Assessment & Plan:  This visit occurred during the SARS-CoV-2 public health emergency.  Safety protocols were in place, including screening questions prior to the visit, additional usage of staff PPE, and extensive cleaning of exam room while observing appropriate contact time as indicated for disinfecting solutions.   Problem List Items Addressed This Visit    Elevated blood pressure reading without diagnosis of hypertension    Improved BP control today.       Diabetes mellitus type 2, uncontrolled, with complications (HCC) - Primary    Anticipate marked improvement based on log of sugars he brings. Reviewed healthy diet and lifestyle changes to date - congratulated! Check fructosamine levels today as well as BMP. Continue sustainable changes to date.  Some GI upset with IR metformin - will trial XR metformin instead 1000mg  nightly. Update with effect.  Discussed pneumovax - declines for now.  RTC 2-3 mo CPE      Relevant Medications   metFORMIN (GLUCOPHAGE XR) 500 MG 24 hr tablet   atorvastatin (LIPITOR) 20 MG tablet   Other Relevant Orders   Basic metabolic panel   Fructosamine   Dyslipidemia associated with type 2 diabetes mellitus (HCC)    Start atorvastatin 20mg  in diabetic. Monitor for myalgias, if they develop then drop dose to MWF  The 10-year ASCVD risk score DC ., et al., 2013) is: 7.8%   Values used to calculate the score:     Age: 47 years     Sex: Male     Is Non-Hispanic African American: Yes     Diabetic: Yes     Tobacco smoker: No     Systolic Blood Pressure: 132 mmHg     Is BP treated: No     HDL Cholesterol: 29 mg/dL     Total Cholesterol: 175 mg/dL       Relevant Medications   metFORMIN (GLUCOPHAGE XR) 500 MG 24 hr tablet   atorvastatin (LIPITOR) 20 MG tablet   Tinea pedis     Discussed foot care - rec lotrimin between toes       Pes planus    Bilateral severe with collapse of medial ankle and loss of longitudinal arch. Pt asxs. Discussed possible eval by sports med ([facility]) to consider custom orthotics.           Meds ordered this encounter  Medications  . metFORMIN (GLUCOPHAGE XR) 500 MG 24 hr tablet    Sig: Take 2 tablets (  1,000 mg total) by mouth at bedtime.    Dispense:  180 tablet    Refill:  1    To replace immediate release metformin  . atorvastatin (LIPITOR) 20 MG tablet    Sig: Take 1 tablet (20 mg total) by mouth daily.    Dispense:  90 tablet    Refill:  3   Orders Placed This Encounter  Procedures  . Basic metabolic panel  . Fructosamine    Patient Instructions  Labs today  Change metformin to extended release (XR) - take 2 500mg  tablets at night time.  Start cholesterol medicine atorvastatin 20mg  daily - sent to pharmacy.  Schedule diabetic eye exam at your convenience, have them send me report.  Consider pneumovax pneumonia shot - recommended in all diabetics.  For breakdown between toes - make sure to pat dry after shower, use lotrimin antifungal cream daily to areas for 3-4 weeks.  Return as needed or in 2-3 months for physical   Follow up plan: Return in about 3 months (around 05/06/2021) for annual exam, prior fasting for blood work.  , MD

## 2021-02-03 NOTE — Assessment & Plan Note (Signed)
Discussed foot care - rec lotrimin between toes

## 2021-02-03 NOTE — Assessment & Plan Note (Signed)
Improved BP control today.

## 2021-02-05 LAB — FRUCTOSAMINE: Fructosamine: 283 umol/L (ref 205–285)

## 2021-02-08 ENCOUNTER — Telehealth: Payer: Self-pay | Admitting: Family Medicine

## 2021-02-08 NOTE — Telephone Encounter (Signed)
plz notify kidneys, electrolytes returned normal. Fructosamine returned equivalent to A1c of 6.9% - now in controlled diabetes range - congratulations! He was able to drop his A1c from 12.8 down to 6.9% with healthy changes to date. Recommend continue metformin XR at this time, keep f/u appt (we may need to reschedule as I will be out of town).

## 2021-02-08 NOTE — Telephone Encounter (Signed)
Left message on vm per dpr relaying Dr. G's message.  

## 2021-02-10 NOTE — Telephone Encounter (Signed)
Spoke with asking if he received my vm.  Confirms he did, verbalizes understanding and someone already called about r/s f/u.

## 2021-02-20 ENCOUNTER — Other Ambulatory Visit: Payer: Self-pay | Admitting: Family Medicine

## 2021-02-20 DIAGNOSIS — E1165 Type 2 diabetes mellitus with hyperglycemia: Secondary | ICD-10-CM

## 2021-02-20 DIAGNOSIS — E1169 Type 2 diabetes mellitus with other specified complication: Secondary | ICD-10-CM

## 2021-02-20 DIAGNOSIS — IMO0002 Reserved for concepts with insufficient information to code with codable children: Secondary | ICD-10-CM

## 2021-02-22 ENCOUNTER — Other Ambulatory Visit: Payer: BC Managed Care – PPO

## 2021-03-01 ENCOUNTER — Encounter: Payer: BC Managed Care – PPO | Admitting: Family Medicine

## 2021-03-08 DIAGNOSIS — M5417 Radiculopathy, lumbosacral region: Secondary | ICD-10-CM | POA: Diagnosis not present

## 2021-03-08 DIAGNOSIS — M9901 Segmental and somatic dysfunction of cervical region: Secondary | ICD-10-CM | POA: Diagnosis not present

## 2021-03-08 DIAGNOSIS — G243 Spasmodic torticollis: Secondary | ICD-10-CM | POA: Diagnosis not present

## 2021-03-08 DIAGNOSIS — M9902 Segmental and somatic dysfunction of thoracic region: Secondary | ICD-10-CM | POA: Diagnosis not present

## 2021-03-10 DIAGNOSIS — M5417 Radiculopathy, lumbosacral region: Secondary | ICD-10-CM | POA: Diagnosis not present

## 2021-03-10 DIAGNOSIS — M9902 Segmental and somatic dysfunction of thoracic region: Secondary | ICD-10-CM | POA: Diagnosis not present

## 2021-03-10 DIAGNOSIS — G243 Spasmodic torticollis: Secondary | ICD-10-CM | POA: Diagnosis not present

## 2021-03-10 DIAGNOSIS — M9901 Segmental and somatic dysfunction of cervical region: Secondary | ICD-10-CM | POA: Diagnosis not present

## 2021-03-12 DIAGNOSIS — Z3009 Encounter for other general counseling and advice on contraception: Secondary | ICD-10-CM | POA: Diagnosis not present

## 2021-03-12 DIAGNOSIS — R310 Gross hematuria: Secondary | ICD-10-CM | POA: Diagnosis not present

## 2021-03-26 ENCOUNTER — Other Ambulatory Visit (INDEPENDENT_AMBULATORY_CARE_PROVIDER_SITE_OTHER): Payer: BC Managed Care – PPO

## 2021-03-26 ENCOUNTER — Other Ambulatory Visit: Payer: Self-pay

## 2021-03-26 DIAGNOSIS — E1165 Type 2 diabetes mellitus with hyperglycemia: Secondary | ICD-10-CM | POA: Diagnosis not present

## 2021-03-26 DIAGNOSIS — E785 Hyperlipidemia, unspecified: Secondary | ICD-10-CM | POA: Diagnosis not present

## 2021-03-26 DIAGNOSIS — E118 Type 2 diabetes mellitus with unspecified complications: Secondary | ICD-10-CM | POA: Diagnosis not present

## 2021-03-26 DIAGNOSIS — E1169 Type 2 diabetes mellitus with other specified complication: Secondary | ICD-10-CM

## 2021-03-26 DIAGNOSIS — IMO0002 Reserved for concepts with insufficient information to code with codable children: Secondary | ICD-10-CM

## 2021-03-26 LAB — BASIC METABOLIC PANEL
BUN: 14 mg/dL (ref 6–23)
CO2: 27 mEq/L (ref 19–32)
Calcium: 9.6 mg/dL (ref 8.4–10.5)
Chloride: 104 mEq/L (ref 96–112)
Creatinine, Ser: 1.15 mg/dL (ref 0.40–1.50)
GFR: 78.59 mL/min (ref >60.00)
Glucose, Bld: 120 mg/dL — ABNORMAL HIGH (ref 70–99)
Potassium: 4.3 mEq/L (ref 3.5–5.1)
Sodium: 139 mEq/L (ref 135–145)

## 2021-03-26 LAB — LIPID PANEL
Cholesterol: 161 mg/dL (ref 0–200)
HDL: 33.7 mg/dL — ABNORMAL LOW (ref >39.00)
LDL Cholesterol: 109 mg/dL — ABNORMAL HIGH (ref 0–99)
NonHDL: 126.93
Total CHOL/HDL Ratio: 5
Triglycerides: 92 mg/dL (ref 0.0–149.0)
VLDL: 18.4 mg/dL (ref 0.0–40.0)

## 2021-03-30 LAB — FRUCTOSAMINE: Fructosamine: 264 umol/L (ref 205–285)

## 2021-04-02 ENCOUNTER — Encounter: Payer: Self-pay | Admitting: Family Medicine

## 2021-04-02 ENCOUNTER — Other Ambulatory Visit: Payer: Self-pay

## 2021-04-02 ENCOUNTER — Ambulatory Visit (INDEPENDENT_AMBULATORY_CARE_PROVIDER_SITE_OTHER): Payer: BC Managed Care – PPO | Admitting: Family Medicine

## 2021-04-02 VITALS — BP 140/90 | HR 68 | Temp 97.5°F | Ht 72.5 in | Wt 263.0 lb

## 2021-04-02 DIAGNOSIS — E785 Hyperlipidemia, unspecified: Secondary | ICD-10-CM

## 2021-04-02 DIAGNOSIS — E1165 Type 2 diabetes mellitus with hyperglycemia: Secondary | ICD-10-CM | POA: Diagnosis not present

## 2021-04-02 DIAGNOSIS — E118 Type 2 diabetes mellitus with unspecified complications: Secondary | ICD-10-CM | POA: Diagnosis not present

## 2021-04-02 DIAGNOSIS — E1169 Type 2 diabetes mellitus with other specified complication: Secondary | ICD-10-CM | POA: Diagnosis not present

## 2021-04-02 DIAGNOSIS — IMO0002 Reserved for concepts with insufficient information to code with codable children: Secondary | ICD-10-CM

## 2021-04-02 DIAGNOSIS — R030 Elevated blood-pressure reading, without diagnosis of hypertension: Secondary | ICD-10-CM | POA: Diagnosis not present

## 2021-04-02 LAB — POCT GLYCOSYLATED HEMOGLOBIN (HGB A1C): Hemoglobin A1C: 6.4 % — AB (ref 4.0–5.6)

## 2021-04-02 NOTE — Progress Notes (Signed)
Patient ID: Barron Vanloan, male    DOB: July 01, 1981, 40 y.o.   MRN: [id number]  This visit was conducted in person.  BP 140/90   Pulse 68   Temp (!) 97.5 F (36.4 C) (Temporal)   Ht 6' 0.5" (1.842 m)   Wt 263 lb (119.3 kg)   SpO2 98%   BMI 35.18 kg/m    CC: DM f/u visit  Subjective:   HPI: Demarkus Remmel is a 40 y.o. male presenting on 04/02/2021 for Annual Exam (Wants to discuss Lipitor. )   Ongoing weight loss noted through healthy diet and lifestyle changes. Has met with diabetes educator.   HLD - tried atorvastatin 85m - felt strange after taking medication ?jittery.   Upcoming vasectomy.   DM - does regularly check sugars 90-120s throughout the day. Compliant with antihyperglycemic regimen which includes: metformin XR 10025mnightly - tolerating this better than IR metformin. Denies low sugars or hypoglycemic symptoms. Denies paresthesias. Last diabetic eye exam - upcoming next week. Pneumovax: Due. Glucometer brand: Contour. DSME: 12/2020. Lab Results  Component Value Date   HGBA1C 6.4 (A) 04/02/2021   Diabetic Foot Exam - Simple   No data filed    Lab Results  Component Value Date   MICROALBUR 3.0 (H) 12/23/2020         Relevant past medical, surgical, family and social history reviewed and updated as indicated. Interim medical history since our last visit reviewed. Allergies and medications reviewed and updated. Outpatient Medications Prior to Visit  Medication Sig Dispense Refill  . APPLE CIDER VINEGAR PO Take 2 capsules by mouth daily.    . metFORMIN (GLUCOPHAGE XR) 500 MG 24 hr tablet Take 2 tablets (1,000 mg total) by mouth at bedtime. 180 tablet 1  . atorvastatin (LIPITOR) 20 MG tablet Take 1 tablet (20 mg total) by mouth daily. (Patient not taking: Reported on 04/02/2021) 90 tablet 3   No facility-administered medications prior to visit.     Per HPI unless specifically indicated in ROS section below Review of Systems Objective:  BP 140/90   Pulse  68   Temp (!) 97.5 F (36.4 C) (Temporal)   Ht 6' 0.5" (1.842 m)   Wt 263 lb (119.3 kg)   SpO2 98%   BMI 35.18 kg/m   Wt Readings from Last 3 Encounters:  04/02/21 263 lb (119.3 kg)  02/03/21 269 lb 6 oz (122.2 kg)  01/01/21 274 lb 4.8 oz (124.4 kg)      Physical Exam Vitals and nursing note reviewed.  Constitutional:      Appearance: Normal appearance. He is not ill-appearing.  Cardiovascular:     Rate and Rhythm: Normal rate and regular rhythm.     Pulses: Normal pulses.     Heart sounds: Normal heart sounds. No murmur heard.   Pulmonary:     Effort: Pulmonary effort is normal. No respiratory distress.     Breath sounds: Normal breath sounds. No wheezing, rhonchi or rales.  Musculoskeletal:        General: Normal range of motion.     Right lower leg: No edema.     Left lower leg: No edema.  Neurological:     Mental Status: He is alert.  Psychiatric:        Mood and Affect: Mood normal.        Behavior: Behavior normal.       Results for orders placed or performed in visit on 04/02/21  POCT glycosylated hemoglobin (Hb A1C)  Result Value Ref Range   Hemoglobin A1C 6.4 (A) 4.0 - 5.6 %   HbA1c POC (<> result, manual entry)     HbA1c, POC (prediabetic range)     HbA1c, POC (controlled diabetic range)     Assessment & Plan:  This visit occurred during the SARS-CoV-2 public health emergency.  Safety protocols were in place, including screening questions prior to the visit, additional usage of staff PPE, and extensive cleaning of exam room while observing appropriate contact time as indicated for disinfecting solutions.   Problem List Items Addressed This Visit    Elevated blood pressure reading without diagnosis of hypertension    Borderline reading today. Continue to encourage ongoing weight loss for tighter bp control.       Diabetes mellitus type 2, uncontrolled, with complications ([place]) - Primary    Ongoing good control based on recent A1c and fructosamine  readings - comparable readings.  Tolerating XR metformin. Congratulated on tight control to date.  He is motivated to continue healthy diet and lifestyle changes to date.  RTC 4-6 wks DM f/u visit  Encouraged he get Prevnar 20.       Relevant Orders   POCT glycosylated hemoglobin (Hb A1C) (Completed)   Dyslipidemia associated with type 2 diabetes mellitus ([place])    Possible trouble tolerating atorvastatin - will retrial at 1/2 tab to start, then titrate if tolerated.  The 10-year ASCVD risk score Mikey Bussing DC Brooke Bonito., et al., 2013) is: 8.1%   Values used to calculate the score:     Age: 71 years     Sex: Male     Is Non-Hispanic African American: Yes     Diabetic: Yes     Tobacco smoker: No     Systolic Blood Pressure: 604 mmHg     Is BP treated: No     HDL Cholesterol: 33.7 mg/dL     Total Cholesterol: 161 mg/dL           No orders of the defined types were placed in this encounter.  Orders Placed This Encounter  Procedures  . POCT glycosylated hemoglobin (Hb A1C)    Patient Instructions  Retry atorvastatin, but start at 1/2 pill daily for a week to see if fully tolerated before increasing to whole pill.  Congrats on healthy changes to date - continue this! Continue metformin. Return as needed or in 4-6 months for physical.  Consider pneumonia vaccine.    Follow up plan: Return in about 4 months (around 08/03/2021) for annual exam, prior fasting for blood work.  Ria Bush, MD

## 2021-04-02 NOTE — Patient Instructions (Addendum)
Retry atorvastatin, but start at 1/2 pill daily for a week to see if fully tolerated before increasing to whole pill.  Congrats on healthy changes to date - continue this! Continue metformin. Return as needed or in 4-6 months for physical.  Consider pneumonia vaccine.

## 2021-04-02 NOTE — Assessment & Plan Note (Addendum)
Ongoing good control based on recent A1c and fructosamine readings - comparable readings.  Tolerating XR metformin. Congratulated on tight control to date.  He is motivated to continue healthy diet and lifestyle changes to date.  RTC 4-6 wks DM f/u visit  Encouraged he get Prevnar 20.

## 2021-04-02 NOTE — Assessment & Plan Note (Signed)
Possible trouble tolerating atorvastatin - will retrial at 1/2 tab to start, then titrate if tolerated.  The 10-year ASCVD risk score Denman George DC Montez Hageman., et al., 2013) is: 8.1%   Values used to calculate the score:     Age: 40 years     Sex: Male     Is Non-Hispanic African American: Yes     Diabetic: Yes     Tobacco smoker: No     Systolic Blood Pressure: 140 mmHg     Is BP treated: No     HDL Cholesterol: 33.7 mg/dL     Total Cholesterol: 161 mg/dL

## 2021-04-02 NOTE — Assessment & Plan Note (Signed)
Borderline reading today. Continue to encourage ongoing weight loss for tighter bp control.

## 2021-04-30 ENCOUNTER — Other Ambulatory Visit: Payer: BC Managed Care – PPO

## 2021-05-07 ENCOUNTER — Encounter: Payer: BC Managed Care – PPO | Admitting: Family Medicine

## 2021-05-14 DIAGNOSIS — Z302 Encounter for sterilization: Secondary | ICD-10-CM | POA: Diagnosis not present

## 2021-05-26 DIAGNOSIS — N498 Inflammatory disorders of other specified male genital organs: Secondary | ICD-10-CM | POA: Diagnosis not present

## 2021-06-03 DIAGNOSIS — Z20822 Contact with and (suspected) exposure to covid-19: Secondary | ICD-10-CM | POA: Diagnosis not present

## 2021-06-03 DIAGNOSIS — Z03818 Encounter for observation for suspected exposure to other biological agents ruled out: Secondary | ICD-10-CM | POA: Diagnosis not present

## 2021-08-26 DIAGNOSIS — H5213 Myopia, bilateral: Secondary | ICD-10-CM | POA: Diagnosis not present

## 2021-08-26 DIAGNOSIS — E119 Type 2 diabetes mellitus without complications: Secondary | ICD-10-CM | POA: Diagnosis not present

## 2021-08-26 DIAGNOSIS — H52223 Regular astigmatism, bilateral: Secondary | ICD-10-CM | POA: Diagnosis not present

## 2021-08-26 LAB — HM DIABETES EYE EXAM

## 2021-09-03 ENCOUNTER — Encounter: Payer: Self-pay | Admitting: Family Medicine

## 2021-12-08 DIAGNOSIS — M9905 Segmental and somatic dysfunction of pelvic region: Secondary | ICD-10-CM | POA: Diagnosis not present

## 2021-12-08 DIAGNOSIS — M9903 Segmental and somatic dysfunction of lumbar region: Secondary | ICD-10-CM | POA: Diagnosis not present

## 2021-12-08 DIAGNOSIS — M5386 Other specified dorsopathies, lumbar region: Secondary | ICD-10-CM | POA: Diagnosis not present

## 2021-12-08 DIAGNOSIS — M531 Cervicobrachial syndrome: Secondary | ICD-10-CM | POA: Diagnosis not present

## 2021-12-09 DIAGNOSIS — M531 Cervicobrachial syndrome: Secondary | ICD-10-CM | POA: Diagnosis not present

## 2021-12-09 DIAGNOSIS — M9903 Segmental and somatic dysfunction of lumbar region: Secondary | ICD-10-CM | POA: Diagnosis not present

## 2021-12-09 DIAGNOSIS — M9905 Segmental and somatic dysfunction of pelvic region: Secondary | ICD-10-CM | POA: Diagnosis not present

## 2021-12-09 DIAGNOSIS — M5386 Other specified dorsopathies, lumbar region: Secondary | ICD-10-CM | POA: Diagnosis not present

## 2021-12-14 DIAGNOSIS — M5386 Other specified dorsopathies, lumbar region: Secondary | ICD-10-CM | POA: Diagnosis not present

## 2021-12-14 DIAGNOSIS — M9905 Segmental and somatic dysfunction of pelvic region: Secondary | ICD-10-CM | POA: Diagnosis not present

## 2021-12-14 DIAGNOSIS — M531 Cervicobrachial syndrome: Secondary | ICD-10-CM | POA: Diagnosis not present

## 2021-12-14 DIAGNOSIS — M9903 Segmental and somatic dysfunction of lumbar region: Secondary | ICD-10-CM | POA: Diagnosis not present

## 2021-12-15 DIAGNOSIS — M5386 Other specified dorsopathies, lumbar region: Secondary | ICD-10-CM | POA: Diagnosis not present

## 2021-12-15 DIAGNOSIS — M9905 Segmental and somatic dysfunction of pelvic region: Secondary | ICD-10-CM | POA: Diagnosis not present

## 2021-12-15 DIAGNOSIS — M9903 Segmental and somatic dysfunction of lumbar region: Secondary | ICD-10-CM | POA: Diagnosis not present

## 2021-12-15 DIAGNOSIS — M531 Cervicobrachial syndrome: Secondary | ICD-10-CM | POA: Diagnosis not present

## 2021-12-21 DIAGNOSIS — M5386 Other specified dorsopathies, lumbar region: Secondary | ICD-10-CM | POA: Diagnosis not present

## 2021-12-21 DIAGNOSIS — M9903 Segmental and somatic dysfunction of lumbar region: Secondary | ICD-10-CM | POA: Diagnosis not present

## 2021-12-21 DIAGNOSIS — M9905 Segmental and somatic dysfunction of pelvic region: Secondary | ICD-10-CM | POA: Diagnosis not present

## 2021-12-21 DIAGNOSIS — M531 Cervicobrachial syndrome: Secondary | ICD-10-CM | POA: Diagnosis not present

## 2021-12-24 DIAGNOSIS — M5386 Other specified dorsopathies, lumbar region: Secondary | ICD-10-CM | POA: Diagnosis not present

## 2021-12-24 DIAGNOSIS — M531 Cervicobrachial syndrome: Secondary | ICD-10-CM | POA: Diagnosis not present

## 2021-12-24 DIAGNOSIS — M9905 Segmental and somatic dysfunction of pelvic region: Secondary | ICD-10-CM | POA: Diagnosis not present

## 2021-12-24 DIAGNOSIS — M9903 Segmental and somatic dysfunction of lumbar region: Secondary | ICD-10-CM | POA: Diagnosis not present

## 2021-12-28 ENCOUNTER — Other Ambulatory Visit: Payer: Self-pay | Admitting: Family Medicine

## 2021-12-28 NOTE — Telephone Encounter (Signed)
E-scribed refill.  Plz schedule diabetes f/u for additional refills.

## 2022-02-11 NOTE — Telephone Encounter (Signed)
Left message to call office to set up OV for Diabetes. ?

## 2022-03-29 ENCOUNTER — Other Ambulatory Visit: Payer: Self-pay | Admitting: Family Medicine

## 2022-06-30 ENCOUNTER — Other Ambulatory Visit: Payer: Self-pay | Admitting: Family Medicine

## 2022-06-30 NOTE — Telephone Encounter (Signed)
Plz schedule CPE and lab visits.  Then return phn note to Lisa to send refill.  

## 2022-07-01 NOTE — Telephone Encounter (Signed)
Lvm to call back and schedule

## 2022-07-04 NOTE — Telephone Encounter (Signed)
Last CPE 03/2021. We've attempted several times to contact pt with no response. Needs CPE for additional refills.
